# Patient Record
Sex: Male | Born: 1955 | Race: Black or African American | Hispanic: No | Marital: Single | Smoking: Former smoker
Health system: Southern US, Community
[De-identification: ages and names within clinical notes are randomized; demographics above are authoritative.]

## PROBLEM LIST (undated history)

## (undated) DIAGNOSIS — M1990 Unspecified osteoarthritis, unspecified site: Secondary | ICD-10-CM

## (undated) DIAGNOSIS — N400 Enlarged prostate without lower urinary tract symptoms: Secondary | ICD-10-CM

## (undated) DIAGNOSIS — R52 Pain, unspecified: Secondary | ICD-10-CM

## (undated) DIAGNOSIS — Z86718 Personal history of other venous thrombosis and embolism: Secondary | ICD-10-CM

## (undated) DIAGNOSIS — K219 Gastro-esophageal reflux disease without esophagitis: Secondary | ICD-10-CM

## (undated) DIAGNOSIS — N529 Male erectile dysfunction, unspecified: Secondary | ICD-10-CM

## (undated) DIAGNOSIS — I2699 Other pulmonary embolism without acute cor pulmonale: Secondary | ICD-10-CM

## (undated) DIAGNOSIS — I10 Essential (primary) hypertension: Secondary | ICD-10-CM

## (undated) DIAGNOSIS — N401 Enlarged prostate with lower urinary tract symptoms: Secondary | ICD-10-CM

## (undated) HISTORY — PX: JOINT REPLACEMENT: SHX530

## (undated) HISTORY — PX: OTHER SURGICAL HISTORY: SHX169

---

## 1998-06-14 ENCOUNTER — Emergency Department (HOSPITAL_COMMUNITY): Admission: EM | Admit: 1998-06-14 | Discharge: 1998-06-14 | Payer: Self-pay | Admitting: Emergency Medicine

## 1998-06-14 ENCOUNTER — Encounter: Payer: Self-pay | Admitting: Emergency Medicine

## 1999-05-03 ENCOUNTER — Emergency Department (HOSPITAL_COMMUNITY): Admission: EM | Admit: 1999-05-03 | Discharge: 1999-05-03 | Payer: Self-pay | Admitting: Emergency Medicine

## 1999-05-04 ENCOUNTER — Encounter: Payer: Self-pay | Admitting: Emergency Medicine

## 2006-04-14 DIAGNOSIS — I2699 Other pulmonary embolism without acute cor pulmonale: Secondary | ICD-10-CM

## 2006-04-14 HISTORY — PX: HIP SURGERY: SHX245

## 2006-04-14 HISTORY — DX: Other pulmonary embolism without acute cor pulmonale: I26.99

## 2006-10-27 ENCOUNTER — Inpatient Hospital Stay (HOSPITAL_COMMUNITY): Admission: AD | Admit: 2006-10-27 | Discharge: 2006-10-30 | Payer: Self-pay | Admitting: Orthopedic Surgery

## 2006-10-27 HISTORY — PX: TOTAL HIP ARTHROPLASTY: SHX124

## 2007-01-28 ENCOUNTER — Inpatient Hospital Stay (HOSPITAL_COMMUNITY): Admission: EM | Admit: 2007-01-28 | Discharge: 2007-01-29 | Payer: Self-pay | Admitting: Emergency Medicine

## 2007-01-28 DIAGNOSIS — Z86711 Personal history of pulmonary embolism: Secondary | ICD-10-CM

## 2007-01-28 HISTORY — DX: Personal history of pulmonary embolism: Z86.711

## 2007-08-16 ENCOUNTER — Encounter: Admission: RE | Admit: 2007-08-16 | Discharge: 2007-08-16 | Payer: Self-pay | Admitting: Family Medicine

## 2007-12-13 ENCOUNTER — Emergency Department (HOSPITAL_COMMUNITY): Admission: EM | Admit: 2007-12-13 | Discharge: 2007-12-13 | Payer: Self-pay | Admitting: Emergency Medicine

## 2007-12-21 ENCOUNTER — Emergency Department (HOSPITAL_COMMUNITY): Admission: EM | Admit: 2007-12-21 | Discharge: 2007-12-21 | Payer: Self-pay | Admitting: Emergency Medicine

## 2008-08-18 IMAGING — CR DG PORTABLE PELVIS
1 series · 1 of 1 positions shown · non-contrast
Comparison: None available.

CLINICAL DATA: Post left total hip replacement.  
 PORTABLE PELVIS ? 1 VIEW:

[view not recorded]
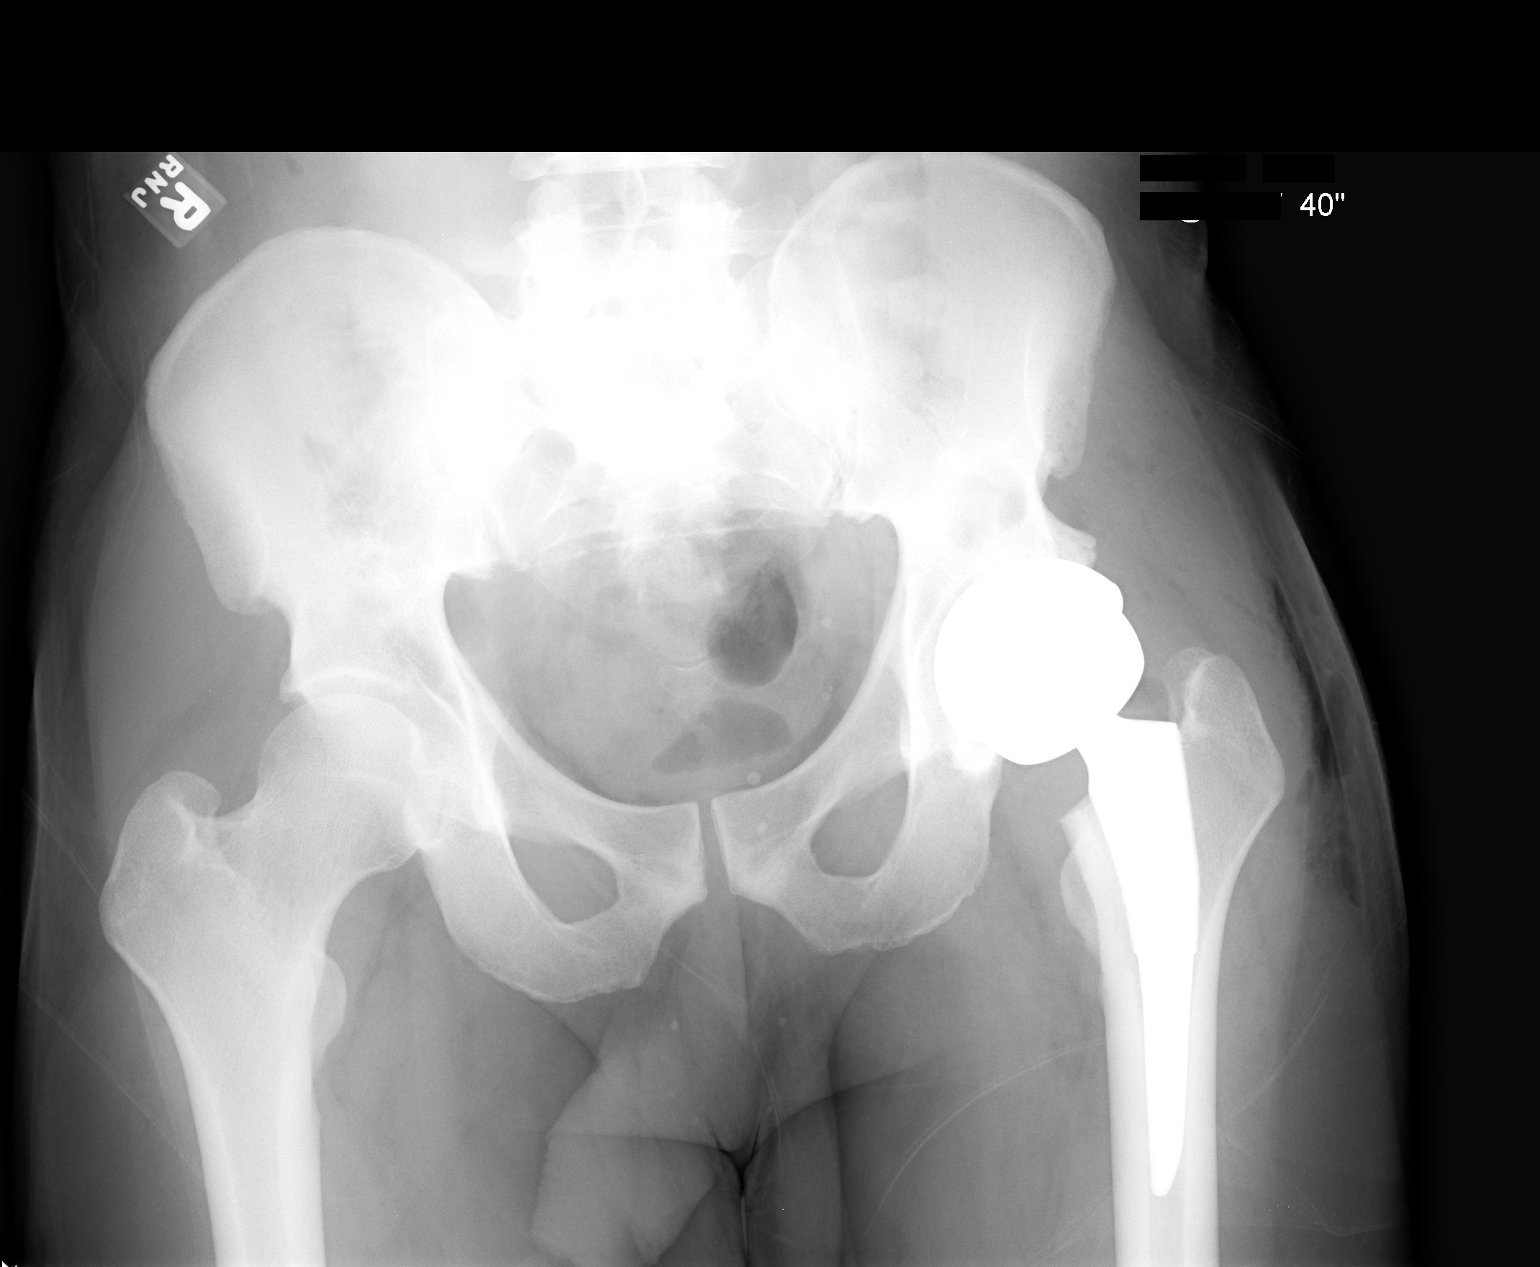

[1 of 1 positions shown; findings below may reference images not displayed]

FINDINGS: Status post left total hip replacement.  The bony elements and prosthesis are well positioned in AP projection.
IMPRESSION: Good position and alignment following left total hip replacement.

## 2009-02-02 ENCOUNTER — Encounter: Admission: RE | Admit: 2009-02-02 | Discharge: 2009-02-02 | Payer: Self-pay | Admitting: Orthopedic Surgery
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
## 2010-08-27 NOTE — Discharge Summary (Signed)
NAMEDIERRE, CREVIER               ACCOUNT NO.:  [id number]   MEDICAL RECORD NO.:  [id number]          PATIENT TYPE:  INP   LOCATION:  4705                         FACILITY:  [facility]   PHYSICIAN:  Michaelyn Barter, M.D. DATE OF BIRTH:  November 16, 1955   DATE OF ADMISSION:  01/28/2007  DATE OF DISCHARGE:  01/29/2007                               DISCHARGE SUMMARY   PRIMARY CARE PHYSICIAN:  Unassigned.   FINAL DIAGNOSIS:  Acute pulmonary emboli.   PROCEDURE:  CT scan of the chest with angiographic contrast completed on  January 28, 2007.   HISTORY OF PRESENT ILLNESS:  Mr. Whitehurst is a 55 year old gentleman with a  past medical history significant only for left total hip replacement  secondary to end stage osteoarthritis on October 27, 2006.  On October 16,  208, he arrived at the hospital with a chief complaint of chest pain.  The chest pain started the morning of his admission.  It was located  bilaterally substernal.  He indicated that the pain radiated to his back  and it was characterized as sharp.  His symptoms were relieved by rest,  and he also complained of some shortness of breath.   PAST MEDICAL HISTORY:  Please see that dictated by Dr. Hannah Beat.   HOSPITAL COURSE:  Acute pulmonary emboli.  The patient had a CT scan of  his chest completed which revealed a pulmonary emboli in the right lower  lobe segmental branch.  Bibasilar pulmonary atelectasis was also noted.  The patient was subsequently started on IV heparin as well as p.r.n.  pain medications.  His pain quickly resolved.  He never complained of  shortness of breath after being admitted into the hospital.  The patient  has indicated multiple times  that he was ready to be discharged home.  I spent quite a bit of time trying to convince the patient to stay in  the hospital secondary to his INR not being therapeutic, however, the  patient still insists that he wants to be discharged home.  Therefore,  the decision has been  made to discharge the patient home as has been  requested multiple times by the patient.  The IV heparin was been  discontinued and the patient has been switched to Lovenox  subcutaneously.   At the time of discharge the patient's vitals were temperature 97.8,  heart rate 70, respirations 20, blood pressure 115/78.  O2 sat 98% on  room air.  The patient appears to be comfortable.  He shows no obvious  distress.   The decision has been made to discharge the patient home as he has  requested.  The patient's PT at the time of discharge is 13.2.  His INR  is 1.   The patient will be discharged home on:  1. Lovenox 120 mg subcutaneous daily.  He will be given a prescription      for a 7-day supply of Lovenox.  2. Warfarin 10 mg, one tablet p.o. daily.   I have called Dr. Earvin Hansen Hill's office to schedule a followup  appointment for Tuesday, October 21st at 5 p.m.  During that time frame,  the patient will have his PT INR checked and his medications can be  adjusted accordingly.      Michaelyn Barter, M.D.  Electronically Signed     OR/MEDQ  D:  01/29/2007  T:  01/29/2007  Job:  [id number]   cc:   Annia Friendly. Loleta Chance, MD  Renaye Rakers, M.D.

## 2010-08-27 NOTE — Op Note (Signed)
Isaac [name], Isaac [name]               ACCOUNT NO.:  [id number]   MEDICAL RECORD NO.:  [id number]          PATIENT TYPE:  INP   LOCATION:  0001                         FACILITY:  [facility]   PHYSICIAN:  Madlyn Frankel. Charlann Boxer, M.D.  DATE OF BIRTH:  [birth year]   DATE OF PROCEDURE:  10/27/2006  DATE OF DISCHARGE:                               OPERATIVE REPORT   PREOPERATIVE DIAGNOSIS:  Left hip end-stage osteoarthritis.   POSTOPERATIVE DIAGNOSIS:  Left hip end-stage osteoarthritis.   PROCEDURE:  Left total hip replacement utilizing DePuy hip system size  54 ASR cup, size 2 standard trilock stem with Gription 47+ 5 ASR ball  and adapter, with autologous acetabular bone grafting.   SURGEON:  Madlyn Frankel. Charlann Boxer, M.D.   ASSISTANT:  Dwyane Luo, PA   ANESTHESIA:  General.   ESTIMATED BLOOD LOSS:  400 mL.   COMPLICATION:  None.   DRAINS:  None.   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   INDICATIONS FOR PROCEDURE:  Isaac [name] is a 55 year old gentleman who  presented to office with left hip pain.  Radiographs revealed the end  stage bone-on-bone arthritis.  Attempts at conservative measures, at  least discussion thereof, failed to provide any significant relief.  He  does not think that the cortisone injection provided any significant  relief.  Given his decreased quality of life and activity level and  requirements from the job standpoint, he wished to proceed with surgical  intervention.  I reviewed with him the risks and benefits of this  procedure long-term sequelae.  I also discussed bearing surface in a  young active male, felt that the best opportunity for long-term  survivability based on the information we have available was a metal-on-  metal hip.  Risks, benefits discussed and his consent obtained.   PROCEDURE IN DETAIL:  The patient was brought to the  operative theater.  Once adequate anesthesia and preoperative antibiotics administered, 2  grams Ancef, the patient was positioned in the right lateral decubitus  position with left side up, left lower extremity was then pre scrubbed  and prepped and draped in sterile fashion.   The lateral based incision was made for posterior approach to the hip.  This iliotibial band and gluteus fascia incised in line for posterior  approach.  The short external rotators were identified and taken down  separate from posterior capsule.  Now capsulotomy was made preserving  the posterior leaflet for later anatomic repair as well as protection  against the sciatic nerve from retractors.   At this point the neck osteotomy was made based off anatomic landmarks  preoperative template.  Attention was first directed to femur. Femoral  preparation was carried out per protocol with a starting reamer followed  by canal finder.  The patient was noted to have a type A femur with a  very tight meta-diaphyseal junction.  I began broaching with a 0 broach  and was unable to get up to a  size 2 broach which matched the neck cut.  Following this I packed femoral canal and attended to the acetabulum.  The acetabulum was noted to have a significant osteophytes and an  ossified labrum and transverse ligament.  Following exposure I then used  an osteotome debrided from posterior inferior osteophyte.  I then began  reaming with a 43 reamer and then went to the even reamers and went up  to size 50 reamer.  At this point there was excellent bony bed  preparation.  I then reamed up to 51 and 53 reamer to solidify my  preparation.  The cyst in the superior acetabulum identified and using a  small curette, I opened up this area and debrided it out.  The lining  was removed.  I then packed it with approximately 15 mL of autologous  bone graft from femoral head and neck.  Following this debridement and  packing, a size 54 ASR cup  was impacted with 35 degrees of abduction, 15  to 20 degrees of forward flexion.  It was anatomically beneath the  anterior Cloe and lined up with the ischium posteriorly.  At this point  attention was redirected back to the femur.  Trial reduction was carried  out with size 2 standard neck and a 47 ball with +2 adapter.  The hip  stability was excellent; however, felt that the patient was just a  little bit short.  I went ahead and used a +5 adapter.  All trial  components removed and the femoral 2 standard stem was impacted to the  level where the broach was.  The final 47+5 ASR with adapter was  impacted onto a clean dry trunnion.  The hip reduced.  The hip was  irrigated throughout the case again at this point.  Following hemostasis  in the posterior capsule I did inject 5 mL of Floseal in this tissue as  well.  The posterior capsule was reapproximated using #1 Ethibond, the  iliotibial band with #1 Ethibond and gluteus fascia with #1 Vicryl.  The  remainder of wound was closed with 2-0 Vicryl and 4-0 running Monocryl.  Hip was cleaned, dried, dressed sterilely with Steri-Strips and Mepilex  dressing.  The patient was brought to recovery in stable condition.      Madlyn Frankel Charlann Boxer, M.D.  Electronically Signed     MDO/MEDQ  D:  10/27/2006  T:  10/27/2006  Job:  [id number]

## 2010-08-27 NOTE — H&P (Signed)
NAMEPROMISE, BUSHONG               ACCOUNT NO.:  [id number]   MEDICAL RECORD NO.:  [id number]          PATIENT TYPE:  INP   LOCATION:  4705                         FACILITY:  [facility]   PHYSICIAN:  Hettie Holstein, D.O.    DATE OF BIRTH:  [birth year]   DATE OF ADMISSION:  01/28/2007  DATE OF DISCHARGE:  01/29/2007                              HISTORY & PHYSICAL   PRIMARY CARE PHYSICIAN:  Unassigned.   CHIEF COMPLAINT:  Chest pain.   HISTORY OF PRESENT ILLNESS:  Mr. Abdallah is a very pleasant 55 year old  male with history of osteoarthritis status post left hip replacement in  July 2008 who had been doing fairly well, completed his postoperative  course of Lovenox and returned to work.  At work today around 9:15 a.m.  he had some right chest pain and back pain.  He continued to work.  Around noon, however, he developed some sudden onset of shortness of  breath and he presented to the medical office at his place of employment  who directed him to the urgent care center where he underwent  evaluation, and ultimately was directed to the emergency department  where he underwent CT scan of his chest that revealed a pulmonary  embolus.  His EKG was unremarkable.   PAST MEDICAL HISTORY:  Significant for:  1. Esophageal reflux.  2. Osteoarthritis as described above, status post left total hip      replacement in July 2008.   MEDICATIONS AT HOME:  None.   ALLERGIES:  No known drug allergies.   SOCIAL HISTORY:  He works as a Location manager at [employer].  He smokes  only occasional tobacco, occasional alcohol.  He is not married, has no  kids.   FAMILY HISTORY:  His mom is age 102 with diabetes.  His biological  father's health history is unknown.   REVIEW OF SYSTEMS:  He has been in his usual state of health.  No  swelling or pain in his lower extremities or calf.  He feels that he has  recovered quite well from his surgery back in July, had no  complications.  In any event, further review  of systems is unremarkable.   PHYSICAL EXAMINATION:  1. Acute pulmonary embolus.  2. Recent hip replacement in July 2008.  3. Tobacco use.   PLAN AT THIS TIME:  We will admit Mr. Toral for IV heparin and  coumadinization and employee the assistance of pharmacy to follow and  achieve therapeutic levels prior to discharge.      Hettie Holstein, D.O.  Electronically Signed     ESS/MEDQ  D:  01/28/2007  T:  01/30/2007  Job:  [id number]

## 2010-08-30 NOTE — Discharge Summary (Signed)
NAMEHAWKIN, CHARO               ACCOUNT NO.:  [id number]   MEDICAL RECORD NO.:  [id number]          PATIENT TYPE:  INP   LOCATION:  1605                         FACILITY:  [facility]   PHYSICIAN:  Madlyn Frankel. Charlann Boxer, M.D.  DATE OF BIRTH:  1955/08/18   DATE OF ADMISSION:  10/27/2006  DATE OF DISCHARGE:  10/30/2006                               DISCHARGE SUMMARY   ADMITTING DIAGNOSES:  1. Osteoarthritis.  2. Esophageal reflux disease.   DISCHARGE DIAGNOSES:  1. Osteoarthritis.  2. Esophageal reflux disease.   HISTORY OF PRESENT ILLNESS:  A 55 year old male with history of  persistent progressive left hip and groin pain secondary to bone on bone  degenerative joint disease.  Refractory to all conservative treatments.   CONSULTS:  None.   PROCEDURE:  Left total hip replacement by surgeon Dr. Durene Romans.  Assistant Dwyane Luo.  Components metal-on-metal.   LABORATORY DATA:  CBC on admission, hematocrit 43.1.  Postoperative day  #1, 38.9.  Postoperative day #2, 33.3  and stable.  Coagulation within  normal limits.  Chemistry:  Glucose at 113, all others normal.  Postoperative day #2, glucose 116, BUN 5.  Postoperative day #2, glucose  122, BUN 4.  Kidney function normal.  GI workup normal.  UA all  negative.   Cardiology:  EKG normal sinus rhythm.   Radiology:  1. Portable pelvis postoperatively good position and alignment      following a left total replacement.  2. Chest, two-view, no active cardiopulmonary disease.   HOSPITAL COURSE:  The patient admitted to the hospital for left total  hip replacement and underwent the procedure, tolerated the procedure  well and was admitted to the orthopedic floor.  Did fine throughout his  course of stay.  He remained afebrile.  His pain was well-controlled.  He was afebrile.  After postoperative day #1, dressing was changed on  daily basis.  Wound had no significant drainage.  He had progressed  nicely with his physical therapy.  By day 2,  he was walking at least 100  feet.  He was weightbearing as tolerated with the use a rolling walker  throughout.  By day 3,  he again had progressed.  He was improved.  Dressing changed.  No active drainage.  Neurovascularly intact.  He was  ready for home health care PT.   DISCHARGE DISPOSITION:  Discharged with home health care PT, stable and  improved condition.   DISCHARGE PHYSICAL THERAPY:  He is weightbearing as tolerated with the  use of a rolling walker for 2 weeks, then transition to single-point  cane.  Goals of physical therapy would be to minimize pain, maximize  strength, increase range of motion and encourage independence of  activities of daily living as well as work on gait training and  proprioception.   DISCHARGE DIET:  Regular as tolerated by the patient.   DISCHARGE WOUND CARE:  Keep dry.  Change dressing on a daily basis.   DISCHARGE FOLLOWUP:  1. Follow-up with Dr. Nilsa Nutting office, [phone], in 2 weeks.  2. If the patient develops acute shortness of breath, severe  calf      pain, call emergency services immediately.   DISCHARGE MEDICATIONS:  1. Lovenox 40 mg subcutaneous q.24h. for 11 days.  2. Robaxin 500 mg p.o. q.6h.  3. Colace 100 mg p.o. b.i.d.  4. MiraLax 17 grams p.o. daily.  5. Enteric-coated aspirin 325 mg p.o. daily for 4 weeks after Lovenox      completed.  6. Iron 325 mg p.o. t.i.d. for 3 weeks.  7. Vicodin 5/325 one to two p.o. q.4-6h.     ______________________________  Yetta Glassman. [address]oreta [address], [place]      Madlyn Frankel. Charlann Boxer, M.D.  Electronically Signed    BLM/MEDQ  D:  11/10/2006  T:  11/11/2006  Job:  [id number]

## 2010-08-30 NOTE — H&P (Signed)
Isaac, [name]               ACCOUNT NO.:  [id number]   MEDICAL RECORD NO.:  [id number]        PATIENT TYPE:  LINP   LOCATION:                               FACILITY:  [facility]   PHYSICIAN:  Isaac [name], M.D.  DATE OF BIRTH:  10-Feb-1956   DATE OF ADMISSION:  10/27/2006  DATE OF DISCHARGE:                              HISTORY & PHYSICAL   PROCEDURE:  Left total hip arthroplasty.   CHIEF COMPLAINT:  Left hip groin pain.   HISTORY OF PRESENT ILLNESS:  This is a 55 year old male with a history  of persistent progressive left hip groin pain secondary to bone on bone  degenerative joint disease.  He has been refractory to all conservative  treatments.  He is scheduled for left total hip replacement.   PAST MEDICAL HISTORY:  Healthy, none.   FAMILY MEDICAL HISTORY:  Diabetes.   SOCIAL HISTORY:  He is a social smoker and occasional alcohol.  Primary  caregiver will be Isaac [name] after surgery.   ALLERGIES:  NO KNOWN DRUG ALLERGIES.   MEDICATIONS:  None.   REVIEW OF SYSTEMS:  PULMONARY:  Cough.  Otherwise, see HPI.   PHYSICAL EXAMINATION:  VITAL SIGNS:  Pulse 72, respirations 18, blood  pressure 126/92.  GENERAL:  He is awake, alert and oriented, well-developed, well-  nourished in no acute distress.  NECK:  Supple.  No carotid bruits.  CHEST/LUNGS:  Clear to auscultation bilaterally.  BREASTS:  Deferred.  HEART:  Regular rate and rhythm without gallops, clicks, rubs or  murmurs.  ABDOMEN:  Soft, nontender, nondistended.  Bowel sounds present in all  four quadrants.  GENITOURINARY:  Deferred.  EXTREMITIES:  Left hip has increased pain with range of motion.  SKIN:  Intact.  No signs of cellulitis.  NEUROLOGIC:  Intact distal sensibilities.   LABORATORY DATA AND X-RAY FINDINGS:  Labs, EKG, chest x-ray all pending.   IMPRESSION:  Left hip degenerative joint disease .   PLAN:  Plan of action is a left total hip arthroplasty on October 27, 2006,  at [facility] by  surgeon Dr. Durene [name].  Risks and  complications were discussed.  Questions were encouraged, answered and  reviewed.     ______________________________  Isaac [name], [place]      Isaac [name], M.D.  Electronically Signed    BLM/MEDQ  D:  10/12/2006  T:  10/13/2006  Job:  [id number]

## 2011-01-15 LAB — DIFFERENTIAL
Basophils Absolute: 0
Basophils Relative: 1
Eosinophils Absolute: 0.1
Monocytes Absolute: 0.3
Neutro Abs: 2.1

## 2011-01-15 LAB — POCT I-STAT, CHEM 8
Calcium, Ion: 1.24
Chloride: 108
HCT: 46
Hemoglobin: 15.6
TCO2: 26

## 2011-01-15 LAB — CBC
Hemoglobin: 14.7
MCHC: 33.8
MCV: 87.4
RDW: 14.5

## 2011-01-15 LAB — POCT CARDIAC MARKERS: Myoglobin, poc: 64.7

## 2011-01-22 LAB — APTT: aPTT: 29

## 2011-01-22 LAB — CBC
Hemoglobin: 13.9
MCHC: 33.6
RBC: 4.82
RDW: 15.1 — ABNORMAL HIGH

## 2011-01-22 LAB — I-STAT 8, (EC8 V) (CONVERTED LAB)
BUN: 12
Chloride: 104
Glucose, Bld: 91
Hemoglobin: 15.6
Potassium: 3.9
Sodium: 140
pH, Ven: 7.35 — ABNORMAL HIGH

## 2011-01-22 LAB — PROTIME-INR
INR: 1
Prothrombin Time: 12.8
Prothrombin Time: 13.2

## 2011-01-22 LAB — HEPARIN LEVEL (UNFRACTIONATED): Heparin Unfractionated: 0.75 — ABNORMAL HIGH

## 2011-01-22 LAB — POCT CARDIAC MARKERS
Myoglobin, poc: 53.1
Operator id: 272551
Troponin i, poc: <0.05

## 2011-01-27 LAB — CBC
HCT: 33.3 — ABNORMAL LOW
Hemoglobin: 11.4 — ABNORMAL LOW
MCHC: 34.1
MCV: 84.8
MCV: 85.1
Platelets: 194
RBC: 3.91 — ABNORMAL LOW
RBC: 4.58
WBC: 7.1
WBC: 8.4

## 2011-01-27 LAB — BASIC METABOLIC PANEL
BUN: 5 — ABNORMAL LOW
CO2: 28
Chloride: 101
Chloride: 109
Creatinine, Ser: 1.02
GFR calc Af Amer: >60
GFR calc Af Amer: >60
GFR calc non Af Amer: >60
Potassium: 3.5
Potassium: 3.9

## 2011-01-28 LAB — URINALYSIS, ROUTINE W REFLEX MICROSCOPIC
Bilirubin Urine: NEGATIVE
Glucose, UA: NEGATIVE
Ketones, ur: NEGATIVE
Protein, ur: NEGATIVE

## 2011-01-28 LAB — CBC
HCT: 43.1
MCHC: 34.5
MCV: 83.6
Platelets: 220
RDW: 14.9 — ABNORMAL HIGH

## 2011-01-28 LAB — COMPREHENSIVE METABOLIC PANEL
ALT: 32
AST: 22
Calcium: 9.3
GFR calc Af Amer: >60
Sodium: 140
Total Protein: 6.9

## 2011-01-28 LAB — APTT: aPTT: 28

## 2011-01-28 LAB — TYPE AND SCREEN
ABO/RH(D): B POS
Antibody Screen: NEGATIVE

## 2011-01-28 LAB — ABO/RH: ABO/RH(D): B POS

## 2012-03-16 ENCOUNTER — Emergency Department (HOSPITAL_COMMUNITY)
Admission: EM | Admit: 2012-03-16 | Discharge: 2012-03-16 | Disposition: A | Discharge status: Home / Self Care | Payer: 59 | Attending: Emergency Medicine | Admitting: Emergency Medicine

## 2012-03-16 ENCOUNTER — Emergency Department (HOSPITAL_COMMUNITY): Payer: 59

## 2012-03-16 ENCOUNTER — Encounter (HOSPITAL_COMMUNITY): Payer: Self-pay

## 2012-03-16 DIAGNOSIS — R1013 Epigastric pain: Secondary | ICD-10-CM

## 2012-03-16 DIAGNOSIS — F17200 Nicotine dependence, unspecified, uncomplicated: Secondary | ICD-10-CM | POA: Insufficient documentation

## 2012-03-16 DIAGNOSIS — Z862 Personal history of diseases of the blood and blood-forming organs and certain disorders involving the immune mechanism: Secondary | ICD-10-CM | POA: Insufficient documentation

## 2012-03-16 HISTORY — DX: Personal history of other venous thrombosis and embolism: Z86.718

## 2012-03-16 LAB — COMPREHENSIVE METABOLIC PANEL
Albumin: 3.8 g/dL (ref 3.5–5.2)
Alkaline Phosphatase: 50 U/L (ref 39–117)
BUN: 17 mg/dL (ref 6–23)
Calcium: 9.1 mg/dL (ref 8.4–10.5)
Creatinine, Ser: 1.07 mg/dL (ref 0.50–1.35)
GFR calc Af Amer: 89 mL/min — ABNORMAL LOW (ref >90)
Glucose, Bld: 97 mg/dL (ref 70–99)
Potassium: 4 mEq/L (ref 3.5–5.1)
Total Protein: 6.7 g/dL (ref 6.0–8.3)

## 2012-03-16 LAB — POCT I-STAT TROPONIN I: Troponin i, poc: 0 ng/mL (ref 0.00–0.08)

## 2012-03-16 LAB — LIPASE, BLOOD: Lipase: 46 U/L (ref 11–59)

## 2012-03-16 LAB — CBC
HCT: 40.9 % (ref 39.0–52.0)
MCH: 28.3 pg (ref 26.0–34.0)
MCHC: 34 g/dL (ref 30.0–36.0)
RDW: 14.7 % (ref 11.5–15.5)

## 2012-03-16 MED ORDER — ONDANSETRON HCL 4 MG/2ML IJ SOLN
4.0000 mg | Freq: Once | INTRAMUSCULAR | Status: AC
  Administered 2012-03-16: 4 mg via INTRAVENOUS
  Filled 2012-03-16: qty 2

## 2012-03-16 MED ORDER — MORPHINE SULFATE 4 MG/ML IJ SOLN
4.0000 mg | Freq: Once | INTRAMUSCULAR | Status: AC
  Administered 2012-03-16: 4 mg via INTRAVENOUS
  Filled 2012-03-16: qty 1

## 2012-03-16 MED ORDER — RANITIDINE HCL 150 MG PO TABS: 150.0000 mg | ORAL_TABLET | Freq: Two times a day (BID) | ORAL | Status: DC

## 2012-03-16 MED ORDER — SODIUM CHLORIDE 0.9 % IV SOLN
Freq: Once | INTRAVENOUS | Status: AC
  Administered 2012-03-16: 10 mL via INTRAVENOUS

## 2012-03-16 MED ORDER — IOHEXOL 350 MG/ML SOLN
100.0000 mL | Freq: Once | INTRAVENOUS | Status: AC | PRN
  Administered 2012-03-16: 100 mL via INTRAVENOUS

## 2012-03-16 NOTE — ED Notes (Signed)
Pt reports rip pain only with movement.

## 2012-03-16 NOTE — ED Notes (Signed)
Pt.  Was at work 3rd shift and developed bilateral rib pain,  Pain radiated into his back .  Movement increased his pain. Pt. Denies any injury,  Also reports having lower back pain last week.  Pt. Denies any dizziness or nausea, skin is w/d

## 2012-03-16 NOTE — ED Notes (Signed)
Pt returned from CT °

## 2012-03-16 NOTE — ED Notes (Signed)
Pt brought back to room; pt undressed, in gown, on monitor, continuous pulse oximetry and blood pressure cuff; EKG performed; sheet provided

## 2012-03-16 NOTE — ED Provider Notes (Signed)
History     CSN: [id number]  Arrival date & time 03/16/12  7829   First MD Initiated Contact with Patient 03/16/12 [phone]      Chief Complaint  Patient presents with  . Chest Pain    (Consider location/radiation/quality/duration/timing/severity/associated sxs/prior treatment) HPI Pt presenting with c/o bialteral rib pain- pain started this morning- states it is sharp in nature and worse when he takes a deep breath.  No fever/no cough.  Mild epigastric abdominal pain as well.  He states he pain is similar in nature to when he had a PE in the past.  He no longer takes coumadin.  No anterior chest pain, no sob, no nausea/diaphoresis or radiation of the pain.  No vomiting or fevers.  He has had no treatment for this pain prior to arrival.  There are no other associated systemic symptoms, there are no other alleviating or modifying factors.   Past Medical History  Diagnosis Date  . History of blood clots     lt lung    Past Surgical History  Procedure Date  . Hip surgery     Left    History reviewed. No pertinent family history.  History  Substance Use Topics  . Smoking status: Current Some Day Smoker    Types: Cigars  . Smokeless tobacco: Not on file  . Alcohol Use: Yes     Comment: occasional      Review of Systems ROS reviewed and all otherwise negative except for mentioned in HPI  Allergies  Review of patient's allergies indicates no known allergies.  Home Medications   Current Outpatient Rx  Name  Route  Sig  Dispense  Refill  . IBUPROFEN 200 MG PO TABS   Oral   Take 400 mg by mouth every 6 (six) hours as needed. For pain         . OVER THE COUNTER MEDICATION   Oral   Take 1 tablet by mouth as needed. Medication: over the counter herbal "male enhancement" product         . RANITIDINE HCL 150 MG PO TABS   Oral   Take 1 tablet (150 mg total) by mouth 2 (two) times daily.   60 tablet   0     BP 126/87  Pulse 75  Temp 97.5 F (36.4 C) (Oral)   Resp 12  Ht 5' 7.5" (1.715 m)  Wt 180 lb (81.647 kg)  BMI 27.78 kg/m2  SpO2 100% Vitals reviewed Physical Exam Physical Examination: General appearance - alert, well appearing, and in no distress Mental status - alert, oriented to person, place, and time Eyes - no conjunctival injection, no scleral icterus Mouth - mucous membranes moist, pharynx normal without lesions Chest - clear to auscultation, no wheezes, rales or rhonchi, symmetric air entry Heart - normal rate, regular rhythm, normal S1, S2, no murmurs, rubs, clicks or gallops Abdomen - soft, mild epigastric tenderness to palpation, no gaurding or rebound, nondistended, no masses or organomegaly Extremities - peripheral pulses normal, no pedal edema, no clubbing or cyanosis Skin - normal coloration and turgor, no rashes  ED Course  Procedures (including critical care time)   Date: 03/16/2012  Rate: 76  Rhythm: normal sinus rhythm  QRS Axis: normal  Intervals: normal  ST/T Wave abnormalities: normal  Conduction Disutrbances: none  Narrative Interpretation: unremarkable       Labs Reviewed  COMPREHENSIVE METABOLIC PANEL - Abnormal; Notable for the following:    GFR calc non Af Amer 77 (*)  GFR calc Af Amer 89 (*)     All other components within normal limits  CBC  LIPASE, BLOOD  POCT I-STAT TROPONIN I   Ct Angio Chest Pe W/cm &/or Wo Cm  03/16/2012  *RADIOLOGY REPORT*  Clinical Data: Chest pain, history of pulmonary embolus, bilateral ribs pain  CT ANGIOGRAPHY CHEST  Technique:  Multidetector CT imaging of the chest using the standard protocol during bolus administration of intravenous contrast. Multiplanar reconstructed images including MIPs were obtained and reviewed to evaluate the vascular anatomy.  Contrast: OMNIPAQUE IOHEXOL 350 MG/ML SOLN  Comparison: 12/21/2007  Findings: Images of the thoracic inlet are unremarkable.  Central airways are patent.  Sagittal images of the spine are unremarkable.  There  is no mediastinal hematoma or adenopathy.  Heart size is stable.  No pericardial effusion.  Images of the lung parenchyma shows no acute infiltrate or pulmonary edema.  Mild dependent atelectasis noted lung bases posteriorly.  There is no CT evidence of pulmonary embolus.  The visualized upper abdomen is unremarkable.  No adrenal gland mass is noted.  No pulmonary nodules are identified.  Small hiatal hernia.  No aortic aneurysm.    No hilar adenopathy.  IMPRESSION:  1.  No pulmonary embolus. 2.  No acute infiltrate or pulmonary edema. 3.  Mild dependent atelectasis lung bases posteriorly.   Original Report Authenticated By: Natasha Mead, M.D.      1. Epigastric pain       MDM  Pt presents with c/o bilateral lower rib pain and mild epigastric pain.  Pt states pain feels similar to his prior PE.  Workup in the ED is reassuring including CT angio of chest which does not show PE.  Discharged with strict return precautions.  Pt agreeable with plan.        Ethelda Chick, MD 03/16/12 1331

## 2012-12-15 ENCOUNTER — Encounter (HOSPITAL_COMMUNITY): Payer: Self-pay | Admitting: Pharmacy Technician

## 2012-12-22 ENCOUNTER — Encounter (HOSPITAL_COMMUNITY): Payer: Self-pay

## 2012-12-22 ENCOUNTER — Encounter (HOSPITAL_COMMUNITY)
Admission: RE | Admit: 2012-12-22 | Discharge: 2012-12-22 | Disposition: A | Discharge status: Home / Self Care | Payer: 59 | Source: Ambulatory Visit | Attending: Orthopedic Surgery | Admitting: Orthopedic Surgery

## 2012-12-22 DIAGNOSIS — Y831 Surgical operation with implant of artificial internal device as the cause of abnormal reaction of the patient, or of later complication, without mention of misadventure at the time of the procedure: Secondary | ICD-10-CM | POA: Insufficient documentation

## 2012-12-22 DIAGNOSIS — Z01812 Encounter for preprocedural laboratory examination: Secondary | ICD-10-CM | POA: Insufficient documentation

## 2012-12-22 DIAGNOSIS — Z96649 Presence of unspecified artificial hip joint: Secondary | ICD-10-CM | POA: Insufficient documentation

## 2012-12-22 DIAGNOSIS — T84099A Other mechanical complication of unspecified internal joint prosthesis, initial encounter: Secondary | ICD-10-CM | POA: Insufficient documentation

## 2012-12-22 HISTORY — DX: Pain, unspecified: R52

## 2012-12-22 HISTORY — DX: Gastro-esophageal reflux disease without esophagitis: K21.9

## 2012-12-22 HISTORY — DX: Other pulmonary embolism without acute cor pulmonale: I26.99

## 2012-12-22 HISTORY — DX: Unspecified osteoarthritis, unspecified site: M19.90

## 2012-12-22 LAB — URINALYSIS, ROUTINE W REFLEX MICROSCOPIC
Bilirubin Urine: NEGATIVE
Leukocytes, UA: NEGATIVE
Nitrite: NEGATIVE
Specific Gravity, Urine: 1.021 (ref 1.005–1.030)
pH: 7 (ref 5.0–8.0)

## 2012-12-22 LAB — BASIC METABOLIC PANEL
BUN: 14 mg/dL (ref 6–23)
CO2: 30 mEq/L (ref 19–32)
Chloride: 102 mEq/L (ref 96–112)
GFR calc Af Amer: >90 mL/min (ref >90)
Potassium: 4.5 mEq/L (ref 3.5–5.1)

## 2012-12-22 LAB — SURGICAL PCR SCREEN
MRSA, PCR: NEGATIVE
Staphylococcus aureus: POSITIVE — AB

## 2012-12-22 LAB — CBC
HCT: 45.1 % (ref 39.0–52.0)
Hemoglobin: 15.5 g/dL (ref 13.0–17.0)
MCH: 28.7 pg (ref 26.0–34.0)
MCHC: 34.4 g/dL (ref 30.0–36.0)
MCV: 83.5 fL (ref 78.0–100.0)
Platelets: 220 10*3/uL (ref 150–400)
RBC: 5.4 MIL/uL (ref 4.22–5.81)
RDW: 14.2 % (ref 11.5–15.5)
WBC: 3.7 10*3/uL — ABNORMAL LOW (ref 4.0–10.5)

## 2012-12-22 LAB — PROTIME-INR
INR: 0.97 (ref 0.00–1.49)
Prothrombin Time: 12.7 seconds (ref 11.6–15.2)

## 2012-12-22 LAB — APTT: aPTT: 31 seconds (ref 24–37)

## 2012-12-22 NOTE — Pre-Procedure Instructions (Signed)
CT ANGIO OF CHEST REPORT AND EKG REPORT ARE IN EPIC FROM 03/16/12.

## 2012-12-22 NOTE — Patient Instructions (Signed)
YOUR SURGERY IS SCHEDULED AT [facility]  ON:  Monday  September 15 th  REPORT TO [facility] AT:  5:30 AM      PHONE # FOR SHORT STAY IS [phone]  DO NOT EAT OR DRINK ANYTHING AFTER MIDNIGHT THE NIGHT BEFORE YOUR SURGERY.  YOU MAY BRUSH YOUR TEETH, RINSE OUT YOUR MOUTH--BUT NO WATER, NO FOOD, NO CHEWING GUM, NO MINTS, NO CANDIES, NO CHEWING TOBACCO.  PLEASE TAKE THE FOLLOWING MEDICATIONS THE AM OF YOUR SURGERY WITH A FEW SIPS OF WATER:  NO MEDS TO TAKE   DO NOT BRING VALUABLES, MONEY, CREDIT CARDS.  DO NOT WEAR JEWELRY, MAKE-UP, NAIL POLISH AND NO METAL PINS OR CLIPS IN YOUR HAIR. CONTACT LENS, DENTURES / PARTIALS, GLASSES SHOULD NOT BE WORN TO SURGERY AND IN MOST CASES-HEARING AIDS WILL NEED TO BE REMOVED.  BRING YOUR GLASSES CASE, ANY EQUIPMENT NEEDED FOR YOUR CONTACT LENS. FOR PATIENTS ADMITTED TO THE HOSPITAL--CHECK OUT TIME THE DAY OF DISCHARGE IS 11:00 AM.  ALL INPATIENT ROOMS ARE PRIVATE - WITH BATHROOM, TELEPHONE, TELEVISION AND WIFI INTERNET.                                PLEASE READ OVER ANY  FACT SHEETS THAT YOU WERE GIVEN: MRSA INFORMATION, BLOOD TRANSFUSION INFORMATION, INCENTIVE SPIROMETER INFORMATION. FAILURE TO FOLLOW THESE INSTRUCTIONS MAY RESULT IN THE CANCELLATION OF YOUR SURGERY.   PATIENT SIGNATURE_________________________________

## 2012-12-23 NOTE — H&P (Signed)
TOTAL HIP REVISION ADMISSION H&P  Patient is admitted for left revision total hip arthroplasty.  Subjective:  Chief Complaint: Left hip pain S/P metal-on-metal hp replacement  HPI: Isaac [name], 57 y.o. male, has a history of pain and functional disability in the left hip due to previous metal-on-metal total hip arthroplasty. The indications for the revision total hip arthroplasty are pain and increased metal ion levels in the blood.  Onset of symptoms was gradual starting 4 years ago with rapidlly worsening course since that time.  Prior procedures on the left hip include arthroplasty.  Patient currently rates pain in the left hip at 10 out of 10 with activity.  There is night pain, worsening of pain with activity and weight bearing, trendelenberg gait, pain that interfers with activities of daily living and pain with passive range of motion. Patient has evidence of previous metal-on-metal total hip arthroplasty by imaging studies.  This condition presents safety issues increasing the risk of falls.  There is no current active signs of infection.  Risks, benefits and expectations were discussed with the patient. Patient understand the risks, benefits and expectations and wishes to proceed with surgery.   D/C Plans:   Home with HHPT  Post-op Meds:    Rx given for Xarelto, ASA, Robaxin, Iron, Colace and MiraLax  Tranexamic Acid:   Not to be given - previous PE  Decadron:    To be given  FYI:    Xarelto and then ASA post-op    Past Medical History  Diagnosis Date  . History of blood clots     lt lung  . PE (pulmonary embolism) 2008    PE SEVERAL MONTHS AFTER HIP REPLACEMENT SURGERY  . GERD (gastroesophageal reflux disease)     ONE SEVERE EPISODE - WENT TO [place] 03/16/12.   NOT ON ANY MEDS AT PRESENT  . Arthritis     HX OA LEFT HIP  . 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 Hip surgery  2008    Left TOTAL HIP ARTHROPLASTY  . Joint replacement      LEFT TOTAL HIP ARTHROPLASTY  . Tooth pulled under anesthesia      No prescriptions prior to admission   Allergies  Allergen Reactions  . Other     PT STATES SOME EYEDROP CAUSED EYE REDNESS - PT DOES NOT REMEMBER NAME OF MEDICINE-THIS WAS YEARS AGO    History  Substance Use Topics  . Smoking status: Current Some Day Smoker    Types: Cigars  . Smokeless tobacco: Never Used  . Alcohol Use: Yes     Comment: occasional  WINE OR BEER ; OCCAS CIGAR      Review of Systems  Constitutional: Negative.   HENT: Negative.   Eyes: Negative.   Respiratory: Negative.   Cardiovascular: Negative.   Gastrointestinal: Positive for heartburn.  Genitourinary: Negative.   Musculoskeletal: Positive for joint pain.  Skin: Negative.   Neurological: Negative.   Endo/Heme/Allergies: Negative.   Psychiatric/Behavioral: Negative.     Objective:  Physical Exam  Constitutional: He is oriented to person, place, and time. He appears well-developed and well-nourished.  HENT:  Head: Normocephalic and atraumatic.  Mouth/Throat: Oropharynx is clear and moist.  Eyes: Pupils are equal, round, and reactive to light.  Neck: Neck supple. No JVD present. No tracheal deviation present. No thyromegaly present.  Cardiovascular: Normal rate, regular rhythm, normal heart sounds and intact distal pulses.   Respiratory: Effort normal and breath sounds normal. No stridor. No respiratory distress. He has no wheezes.  GI: Soft. There is no tenderness. There is no guarding.  Musculoskeletal:       Left hip: He exhibits decreased range of motion, decreased strength, tenderness, bony tenderness, crepitus and laceration (healed from previous surgery). He exhibits no deformity.  Lymphadenopathy:    He has no cervical adenopathy.  Neurological: He is alert and oriented to person, place, and time.  Skin: Skin is warm and dry.  Psychiatric:  He has a normal mood and affect.     Labs:  Estimated body mass index is 27.76 kg/(m^2) as calculated from the following:   Height as of 03/16/12: 5' 7.5" (1.715 m).   Weight as of 03/16/12: 81.647 kg (180 lb).  Imaging Review:  Plain radiographs demonstrate previous metal-on-metal total hip arthroplasty of the left hip(s). The bone quality appears to be good for age and reported activity level.   Assessment/Plan:  Left hip, failed previous arthroplasty.  The patient history, physical examination, clinical judgement of the provider and imaging studies are consistent with end stage degenerative joint disease of the left hip(s), previous total hip arthroplasty. Revision total hip arthroplasty is deemed medically necessary. The treatment options including medical management, injection therapy, arthroscopy and arthroplasty were discussed at length. The risks and benefits of total hip arthroplasty were presented and reviewed. The risks due to aseptic loosening, infection, stiffness, dislocation/subluxation,  thromboembolic complications and other imponderables were discussed.  The patient acknowledged the explanation, agreed to proceed with the plan and consent was signed. Patient is being admitted for inpatient treatment for surgery, pain control, PT, OT, prophylactic antibiotics, VTE prophylaxis, progressive ambulation and ADL's and discharge planning. The patient is planning to be discharged home with home health services.    Anastasio Auerbach Bryden Darden   PAC  12/23/2012, 3:36 PM

## 2012-12-27 ENCOUNTER — Ambulatory Visit (HOSPITAL_COMMUNITY): Payer: 59 | Admitting: Certified Registered Nurse Anesthetist

## 2012-12-27 ENCOUNTER — Inpatient Hospital Stay (HOSPITAL_COMMUNITY)
Admission: RE | Admit: 2012-12-27 | Discharge: 2012-12-28 | DRG: 468 | Disposition: A | Discharge status: Home Health | Payer: 59 | Source: Ambulatory Visit | Attending: Orthopedic Surgery | Admitting: Orthopedic Surgery

## 2012-12-27 ENCOUNTER — Encounter (HOSPITAL_COMMUNITY): Payer: Self-pay | Admitting: *Deleted

## 2012-12-27 ENCOUNTER — Ambulatory Visit (HOSPITAL_COMMUNITY): Payer: 59

## 2012-12-27 ENCOUNTER — Encounter (HOSPITAL_COMMUNITY)
Admission: RE | Disposition: A | Discharge status: Home Health | Payer: Self-pay | Source: Ambulatory Visit | Attending: Orthopedic Surgery

## 2012-12-27 ENCOUNTER — Encounter (HOSPITAL_COMMUNITY): Payer: Self-pay | Admitting: Certified Registered Nurse Anesthetist

## 2012-12-27 DIAGNOSIS — M169 Osteoarthritis of hip, unspecified: Secondary | ICD-10-CM | POA: Diagnosis present

## 2012-12-27 DIAGNOSIS — T84099A Other mechanical complication of unspecified internal joint prosthesis, initial encounter: Principal | ICD-10-CM | POA: Diagnosis present

## 2012-12-27 DIAGNOSIS — Z96649 Presence of unspecified artificial hip joint: Secondary | ICD-10-CM

## 2012-12-27 DIAGNOSIS — R7989 Other specified abnormal findings of blood chemistry: Secondary | ICD-10-CM | POA: Diagnosis present

## 2012-12-27 DIAGNOSIS — Y831 Surgical operation with implant of artificial internal device as the cause of abnormal reaction of the patient, or of later complication, without mention of misadventure at the time of the procedure: Secondary | ICD-10-CM | POA: Diagnosis present

## 2012-12-27 DIAGNOSIS — M1610 Unilateral primary osteoarthritis, unspecified hip: Secondary | ICD-10-CM | POA: Diagnosis present

## 2012-12-27 DIAGNOSIS — Z86711 Personal history of pulmonary embolism: Secondary | ICD-10-CM

## 2012-12-27 DIAGNOSIS — Z01812 Encounter for preprocedural laboratory examination: Secondary | ICD-10-CM

## 2012-12-27 DIAGNOSIS — F17200 Nicotine dependence, unspecified, uncomplicated: Secondary | ICD-10-CM | POA: Diagnosis present

## 2012-12-27 HISTORY — PX: TOTAL HIP REVISION: SHX763

## 2012-12-27 LAB — TYPE AND SCREEN: Antibody Screen: NEGATIVE

## 2012-12-27 SURGERY — TOTAL HIP REVISION
Anesthesia: Spinal | Site: Hip | Laterality: Left | Wound class: Clean

## 2012-12-27 MED ORDER — METHOCARBAMOL 500 MG PO TABS
500.0000 mg | ORAL_TABLET | Freq: Four times a day (QID) | ORAL | Status: DC | PRN
  Administered 2012-12-27 – 2012-12-28 (×2): 500 mg via ORAL
  Filled 2012-12-27 (×2): qty 1

## 2012-12-27 MED ORDER — DEXAMETHASONE SODIUM PHOSPHATE 10 MG/ML IJ SOLN: 10.0000 mg | Freq: Once | INTRAMUSCULAR | Status: DC

## 2012-12-27 MED ORDER — DIPHENHYDRAMINE HCL 25 MG PO CAPS: 25.0000 mg | ORAL_CAPSULE | Freq: Four times a day (QID) | ORAL | Status: DC | PRN

## 2012-12-27 MED ORDER — PROMETHAZINE HCL 25 MG/ML IJ SOLN: 6.2500 mg | INTRAMUSCULAR | Status: DC | PRN

## 2012-12-27 MED ORDER — METOCLOPRAMIDE HCL 5 MG/ML IJ SOLN: 5.0000 mg | Freq: Three times a day (TID) | INTRAMUSCULAR | Status: DC | PRN

## 2012-12-27 MED ORDER — DOCUSATE SODIUM 100 MG PO CAPS
100.0000 mg | ORAL_CAPSULE | Freq: Two times a day (BID) | ORAL | Status: DC
  Administered 2012-12-27 – 2012-12-28 (×2): 100 mg via ORAL

## 2012-12-27 MED ORDER — SODIUM CHLORIDE 0.9 % IV SOLN
100.0000 mL/h | INTRAVENOUS | Status: DC
  Administered 2012-12-27: 100 mL/h via INTRAVENOUS
  Administered 2012-12-28: 20 mL/h via INTRAVENOUS
  Filled 2012-12-27 (×4): qty 1000

## 2012-12-27 MED ORDER — EPHEDRINE SULFATE 50 MG/ML IJ SOLN
INTRAMUSCULAR | Status: DC | PRN
  Administered 2012-12-27 (×3): 5 mg via INTRAVENOUS

## 2012-12-27 MED ORDER — MENTHOL 3 MG MT LOZG
1.0000 | LOZENGE | OROMUCOSAL | Status: DC | PRN
  Filled 2012-12-27: qty 9

## 2012-12-27 MED ORDER — ALUM & MAG HYDROXIDE-SIMETH 200-200-20 MG/5ML PO SUSP: 30.0000 mL | ORAL | Status: DC | PRN

## 2012-12-27 MED ORDER — METHOCARBAMOL 100 MG/ML IJ SOLN
500.0000 mg | Freq: Four times a day (QID) | INTRAVENOUS | Status: DC | PRN
  Administered 2012-12-27: 500 mg via INTRAVENOUS
  Filled 2012-12-27 (×2): qty 5

## 2012-12-27 MED ORDER — ONDANSETRON HCL 4 MG/2ML IJ SOLN
INTRAMUSCULAR | Status: DC | PRN
  Administered 2012-12-27: 4 mg via INTRAVENOUS

## 2012-12-27 MED ORDER — CEFAZOLIN SODIUM-DEXTROSE 2-3 GM-% IV SOLR
2.0000 g | INTRAVENOUS | Status: AC
  Administered 2012-12-27: 2 g via INTRAVENOUS

## 2012-12-27 MED ORDER — BUPIVACAINE HCL (PF) 0.5 % IJ SOLN
INTRAMUSCULAR | Status: AC
  Filled 2012-12-27: qty 30

## 2012-12-27 MED ORDER — HYDROMORPHONE HCL PF 1 MG/ML IJ SOLN: 0.2500 mg | INTRAMUSCULAR | Status: DC | PRN

## 2012-12-27 MED ORDER — PROPOFOL INFUSION 10 MG/ML OPTIME
INTRAVENOUS | Status: DC | PRN
  Administered 2012-12-27: 100 ug/kg/min via INTRAVENOUS

## 2012-12-27 MED ORDER — BUPIVACAINE HCL (PF) 0.5 % IJ SOLN
INTRAMUSCULAR | Status: DC | PRN
  Administered 2012-12-27: 3 mL

## 2012-12-27 MED ORDER — CHLORHEXIDINE GLUCONATE 4 % EX LIQD
60.0000 mL | Freq: Once | CUTANEOUS | Status: DC
  Filled 2012-12-27: qty 60

## 2012-12-27 MED ORDER — LACTATED RINGERS IV SOLN
INTRAVENOUS | Status: DC | PRN
  Administered 2012-12-27: 1000 mL
  Administered 2012-12-27: 07:00:00 via INTRAVENOUS

## 2012-12-27 MED ORDER — CELECOXIB 200 MG PO CAPS
200.0000 mg | ORAL_CAPSULE | Freq: Two times a day (BID) | ORAL | Status: DC
  Administered 2012-12-27 – 2012-12-28 (×2): 200 mg via ORAL
  Filled 2012-12-27 (×3): qty 1

## 2012-12-27 MED ORDER — RIVAROXABAN 10 MG PO TABS
10.0000 mg | ORAL_TABLET | ORAL | Status: DC
  Administered 2012-12-28: 10 mg via ORAL
  Filled 2012-12-27 (×2): qty 1

## 2012-12-27 MED ORDER — ONDANSETRON HCL 4 MG PO TABS: 4.0000 mg | ORAL_TABLET | Freq: Four times a day (QID) | ORAL | Status: DC | PRN

## 2012-12-27 MED ORDER — FLEET ENEMA 7-19 GM/118ML RE ENEM: 1.0000 | ENEMA | Freq: Once | RECTAL | Status: AC | PRN

## 2012-12-27 MED ORDER — HYDROMORPHONE HCL PF 1 MG/ML IJ SOLN
0.5000 mg | INTRAMUSCULAR | Status: DC | PRN
  Administered 2012-12-27 (×3): 1 mg via INTRAVENOUS
  Filled 2012-12-27 (×3): qty 1

## 2012-12-27 MED ORDER — MIDAZOLAM HCL 5 MG/5ML IJ SOLN
INTRAMUSCULAR | Status: DC | PRN
  Administered 2012-12-27 (×2): 1 mg via INTRAVENOUS

## 2012-12-27 MED ORDER — SODIUM CHLORIDE 0.9 % IV SOLN
INTRAVENOUS | Status: DC | PRN
  Administered 2012-12-27: 08:00:00 via INTRAVENOUS

## 2012-12-27 MED ORDER — ZOLPIDEM TARTRATE 5 MG PO TABS: 5.0000 mg | ORAL_TABLET | Freq: Every evening | ORAL | Status: DC | PRN

## 2012-12-27 MED ORDER — METOCLOPRAMIDE HCL 10 MG PO TABS: 5.0000 mg | ORAL_TABLET | Freq: Three times a day (TID) | ORAL | Status: DC | PRN

## 2012-12-27 MED ORDER — BISACODYL 10 MG RE SUPP: 10.0000 mg | Freq: Every day | RECTAL | Status: DC | PRN

## 2012-12-27 MED ORDER — ONDANSETRON HCL 4 MG/2ML IJ SOLN: 4.0000 mg | Freq: Four times a day (QID) | INTRAMUSCULAR | Status: DC | PRN

## 2012-12-27 MED ORDER — LACTATED RINGERS IV SOLN: INTRAVENOUS | Status: DC

## 2012-12-27 MED ORDER — PHENYLEPHRINE HCL 10 MG/ML IJ SOLN
INTRAMUSCULAR | Status: DC | PRN
  Administered 2012-12-27 (×2): 40 ug via INTRAVENOUS

## 2012-12-27 MED ORDER — CEFAZOLIN SODIUM-DEXTROSE 2-3 GM-% IV SOLR
INTRAVENOUS | Status: AC
  Filled 2012-12-27: qty 50

## 2012-12-27 MED ORDER — HYDROCODONE-ACETAMINOPHEN 7.5-325 MG PO TABS
1.0000 | ORAL_TABLET | ORAL | Status: DC
  Administered 2012-12-27 (×2): 2 via ORAL
  Administered 2012-12-27: 1 via ORAL
  Administered 2012-12-27 – 2012-12-28 (×3): 2 via ORAL
  Filled 2012-12-27 (×3): qty 2
  Filled 2012-12-27: qty 1
  Filled 2012-12-27 (×2): qty 2

## 2012-12-27 MED ORDER — CEFAZOLIN SODIUM-DEXTROSE 2-3 GM-% IV SOLR
2.0000 g | Freq: Four times a day (QID) | INTRAVENOUS | Status: AC
  Administered 2012-12-27 (×2): 2 g via INTRAVENOUS
  Filled 2012-12-27 (×2): qty 50

## 2012-12-27 MED ORDER — MEPERIDINE HCL 50 MG/ML IJ SOLN: 6.2500 mg | INTRAMUSCULAR | Status: DC | PRN

## 2012-12-27 MED ORDER — 0.9 % SODIUM CHLORIDE (POUR BTL) OPTIME
TOPICAL | Status: DC | PRN
  Administered 2012-12-27: 1000 mL

## 2012-12-27 MED ORDER — FERROUS SULFATE 325 (65 FE) MG PO TABS
325.0000 mg | ORAL_TABLET | Freq: Three times a day (TID) | ORAL | Status: DC
  Administered 2012-12-27 – 2012-12-28 (×2): 325 mg via ORAL
  Filled 2012-12-27 (×6): qty 1

## 2012-12-27 MED ORDER — FENTANYL CITRATE 0.05 MG/ML IJ SOLN
INTRAMUSCULAR | Status: DC | PRN
  Administered 2012-12-27 (×2): 50 ug via INTRAVENOUS

## 2012-12-27 MED ORDER — POLYETHYLENE GLYCOL 3350 17 G PO PACK
17.0000 g | PACK | Freq: Two times a day (BID) | ORAL | Status: DC
  Administered 2012-12-27: 17 g via ORAL

## 2012-12-27 MED ORDER — PHENOL 1.4 % MT LIQD
1.0000 | OROMUCOSAL | Status: DC | PRN
  Filled 2012-12-27: qty 177

## 2012-12-27 MED ORDER — DEXAMETHASONE SODIUM PHOSPHATE 10 MG/ML IJ SOLN
10.0000 mg | Freq: Once | INTRAMUSCULAR | Status: AC
  Administered 2012-12-28: 10 mg via INTRAVENOUS
  Filled 2012-12-27: qty 1

## 2012-12-27 SURGICAL SUPPLY — 61 items
BAG ZIPLOCK 12X15 (MISCELLANEOUS) ×2 IMPLANT
BLADE SAW SGTL 18X1.27X75 (BLADE) ×2 IMPLANT
BRUSH FEMORAL CANAL (MISCELLANEOUS) IMPLANT
CLOTH BEACON ORANGE TIMEOUT ST (SAFETY) ×2 IMPLANT
CUP ACET PNNCL SECTR W/GRIP 56 (Hips) ×1 IMPLANT
DERMABOND ADVANCED (GAUZE/BANDAGES/DRESSINGS) ×1
DERMABOND ADVANCED .7 DNX12 (GAUZE/BANDAGES/DRESSINGS) ×1 IMPLANT
DRAPE INCISE IOBAN 85X60 (DRAPES) ×2 IMPLANT
DRAPE ORTHO SPLIT 77X108 STRL (DRAPES) ×2
DRAPE POUCH INSTRU U-SHP 10X18 (DRAPES) ×2 IMPLANT
DRAPE SURG 17X11 SM STRL (DRAPES) ×2 IMPLANT
DRAPE SURG ORHT 6 SPLT 77X108 (DRAPES) ×2 IMPLANT
DRAPE U-SHAPE 47X51 STRL (DRAPES) ×2 IMPLANT
DRSG AQUACEL AG ADV 3.5X10 (GAUZE/BANDAGES/DRESSINGS) ×2 IMPLANT
DRSG AQUACEL AG ADV 3.5X14 (GAUZE/BANDAGES/DRESSINGS) IMPLANT
DRSG EMULSION OIL 3X16 NADH (GAUZE/BANDAGES/DRESSINGS) IMPLANT
DRSG MEPILEX BORDER 4X4 (GAUZE/BANDAGES/DRESSINGS) IMPLANT
DRSG MEPILEX BORDER 4X8 (GAUZE/BANDAGES/DRESSINGS) IMPLANT
DRSG TEGADERM 4X4.75 (GAUZE/BANDAGES/DRESSINGS) ×2 IMPLANT
DURAPREP 26ML APPLICATOR (WOUND CARE) ×2 IMPLANT
ELECT BLADE TIP CTD 4 INCH (ELECTRODE) ×2 IMPLANT
ELECT REM PT RETURN 9FT ADLT (ELECTROSURGICAL) ×2
ELECTRODE REM PT RTRN 9FT ADLT (ELECTROSURGICAL) ×1 IMPLANT
ELIMINATOR HOLE APEX DEPUY (Hips) ×2 IMPLANT
EVACUATOR 1/8 PVC DRAIN (DRAIN) ×2 IMPLANT
FACESHIELD LNG OPTICON STERILE (SAFETY) ×8 IMPLANT
GAUZE SPONGE 2X2 8PLY STRL LF (GAUZE/BANDAGES/DRESSINGS) ×1 IMPLANT
GLOVE BIOGEL PI IND STRL 7.5 (GLOVE) ×1 IMPLANT
GLOVE BIOGEL PI IND STRL 8 (GLOVE) ×1 IMPLANT
GLOVE BIOGEL PI INDICATOR 7.5 (GLOVE) ×1
GLOVE BIOGEL PI INDICATOR 8 (GLOVE) ×1
GLOVE ECLIPSE 8.0 STRL XLNG CF (GLOVE) ×4 IMPLANT
GOWN BRE IMP PREV XXLGXLNG (GOWN DISPOSABLE) ×6 IMPLANT
GOWN PREVENTION PLUS LG XLONG (DISPOSABLE) ×2 IMPLANT
HANDPIECE INTERPULSE COAX TIP (DISPOSABLE)
HEAD CERAMIC 36 PLUS5 (Hips) ×2 IMPLANT
KIT BASIN OR (CUSTOM PROCEDURE TRAY) ×2 IMPLANT
MANIFOLD NEPTUNE II (INSTRUMENTS) ×2 IMPLANT
NS IRRIG 1000ML POUR BTL (IV SOLUTION) ×4 IMPLANT
PACK TOTAL JOINT (CUSTOM PROCEDURE TRAY) ×2 IMPLANT
PINN SECTOR W/GRIP ACE CUP 56 (Hips) ×2 IMPLANT
PINNACLE ALTRX PLUS 4 N 36X56 (Hips) ×2 IMPLANT
POSITIONER SURGICAL ARM (MISCELLANEOUS) ×2 IMPLANT
PRESSURIZER FEMORAL UNIV (MISCELLANEOUS) IMPLANT
SCREW 6.5MMX25MM (Screw) ×2 IMPLANT
SCREW 6.5MMX30MM (Screw) ×2 IMPLANT
SET HNDPC FAN SPRY TIP SCT (DISPOSABLE) IMPLANT
SPONGE GAUZE 2X2 STER 10/PKG (GAUZE/BANDAGES/DRESSINGS) ×1
SPONGE LAP 18X18 X RAY DECT (DISPOSABLE) IMPLANT
SPONGE LAP 4X18 X RAY DECT (DISPOSABLE) IMPLANT
STAPLER VISISTAT 35W (STAPLE) IMPLANT
SUCTION FRAZIER TIP 10 FR DISP (SUCTIONS) ×2 IMPLANT
SUT MNCRL AB 3-0 PS2 18 (SUTURE) ×2 IMPLANT
SUT VIC AB 1 CT1 36 (SUTURE) ×6 IMPLANT
SUT VIC AB 2-0 CT1 27 (SUTURE) ×3
SUT VIC AB 2-0 CT1 TAPERPNT 27 (SUTURE) ×3 IMPLANT
SUT VLOC 180 0 24IN GS25 (SUTURE) ×4 IMPLANT
TOWEL OR 17X26 10 PK STRL BLUE (TOWEL DISPOSABLE) ×4 IMPLANT
TOWER CARTRIDGE SMART MIX (DISPOSABLE) IMPLANT
TRAY FOLEY CATH 14FRSI W/METER (CATHETERS) ×2 IMPLANT
WATER STERILE IRR 1500ML POUR (IV SOLUTION) ×2 IMPLANT

## 2012-12-27 NOTE — Evaluation (Signed)
Physical Therapy Evaluation Patient Details Name: Isaac [name] MRN: [id number] DOB: 10-17-1955 Today's Date: 12/27/2012 Time: 1535-1610 PT Time Calculation (min): 35 min  PT Assessment / Plan / Recommendation History of Present Illness  per Dr.Olin: Isaac [name]  has presented today for surgery, with the diagnosis of FAILED LEFT TOTAL HIP ARTHROPLASTY  The various methods of treatment have been discussed with the patient and family. After consideration of risks, benefits and other options for treatment, the patient has consented to  Procedure(.  Clinical Impression  Pt with L THA revision presents with decreased ability with all mobility due to decreased strength and ROM> TO benefit from Pt to help transition to home at [facility] level.     PT Assessment  Patient needs continued PT services    Follow Up Recommendations  Home health PT    Does the patient have the potential to tolerate intense rehabilitation      Barriers to Discharge        Equipment Recommendations  3in1 (PT)    Recommendations for Other Services     Frequency 7X/week    Precautions / Restrictions Precautions Precautions: Posterior Hip Precaution Comments: reviewed with pt Posterior Hip precautions.  Restrictions Weight Bearing Restrictions: No (WBAT LLE)   Pertinent Vitals/Pain 5/10      Mobility  Bed Mobility Bed Mobility: Supine to Sit;Sit to Supine Supine to Sit: 4: Min assist;With rails Sit to Supine: Not Tested (comment) Details for Bed Mobility Assistance: cues for sequencing and assist with LE and scooting Transfers Transfers: Sit to Stand;Stand to Sit Sit to Stand: 4: Min assist;With upper extremity assist;From elevated surface Stand to Sit: 4: Min assist;With upper extremity assist Details for Transfer Assistance: cues fror safe use of rW and sequencing for hip precautions Ambulation/Gait Ambulation/Gait Assistance: 4: Min guard Ambulation Distance (Feet): 10 Feet Assistive device:  Rolling walker Ambulation/Gait Assistance Details: cues for sequencing Gait Pattern: Step-to pattern Stairs: No    Exercises Total Joint Exercises Ankle Circles/Pumps: AROM;Left;5 reps;Supine Quad Sets: AAROM;Left;Supine;5 reps Heel Slides: AAROM;Left;5 reps;Supine   PT Diagnosis: Difficulty walking  PT Problem List: Decreased strength;Decreased activity tolerance;Decreased mobility PT Treatment Interventions: DME instruction;Gait training;Stair training;Therapeutic activities;Therapeutic exercise;Functional mobility training;Patient/family education     PT Goals(Current goals can be found in the care plan section) Acute Rehab PT Goals Patient Stated Goal: to returm home soon PT Goal Formulation: With patient Time For Goal Achievement: 01/10/13 Potential to Achieve Goals: Good  Visit Information  Last PT Received On: 12/27/12 History of Present Illness: per Dr.Olin: Isaac [name]  has presented today for surgery, with the diagnosis of FAILED LEFT TOTAL HIP ARTHROPLASTY  The various methods of treatment have been discussed with the patient and family. After consideration of risks, benefits and other options for treatment, the patient has consented to  Procedure(.       Prior Functioning  Home Living Family/patient expects to be discharged to:: Private residence Living Arrangements: Spouse/significant other Available Help at Discharge: Family Type of Home: House Home Access: Stairs to enter Secretary/administrator of Steps: 1+1 Home Layout: One level Home Equipment: Environmental consultant - 2 wheels Additional Comments: will need commode chair/ 3n1 Prior Function Level of Independence: Independent Communication Communication: No difficulties    Cognition  Cognition Arousal/Alertness: Awake/alert Behavior During Therapy: WFL for tasks assessed/performed Overall Cognitive Status: Within Functional Limits for tasks assessed    Extremity/Trunk Assessment Lower Extremity Assessment Lower  Extremity Assessment: LLE deficits/detail LLE Deficits / Details: limited due to surgery but  able to perform quad set adn heel slide with some ease  LLE: Unable to fully assess due to pain   Balance    End of Session PT - End of Session Equipment Utilized During Treatment: Gait belt Activity Tolerance: Patient tolerated treatment well Patient left: in chair;with family/visitor present Nurse Communication: Mobility status  GP     Isaac [name] 12/27/2012, 10:18 PM Isaac [name], PT Pager: [phone] 12/27/2012

## 2012-12-27 NOTE — Interval H&P Note (Signed)
History and Physical Interval Note:  12/27/2012 7:10 AM  Isaac [name]  has presented today for surgery, with the diagnosis of FAILED LEFT TOTAL HIP ARTHROPLASTY  The various methods of treatment have been discussed with the patient and family. After consideration of risks, benefits and other options for treatment, the patient has consented to  Procedure(s): LEFT TOTAL HIP ARTHROPLASTY REVISION (Left) as a surgical intervention .  The patient's history has been reviewed, patient examined, no change in status, stable for surgery.  I have reviewed the patient's chart and labs.  Questions were answered to the patient's satisfaction.     Shelda Pal

## 2012-12-27 NOTE — Op Note (Signed)
Isaac [name], Isaac [name]               ACCOUNT NO.:  [id number]  MEDICAL RECORD NO.:  [id number]  LOCATION:  1617                         FACILITY:  [facility]  PHYSICIAN:  Madlyn Frankel. Charlann Boxer, M.D.  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  PROCEDURE:  Revision of left total hip arthroplasty utilize in a size 56 Gription sector, pinnacle cup, a 36+4 neutral liner, and a 36+5 Delta ceramic ball.  SURGEON:  Madlyn Frankel. Charlann Boxer, M.D.  ASSISTANT:  Lanney Gins, PA-C.  Of note, Isaac [name] was present for the entirety of the case from preoperative position, perioperative management, operative extremity, general facilitation case facilitation case from primary wound closure.  ANESTHESIA:  General.  DRAINS:  None.  COMPLICATIONS:  None.  SPECIMENS:  The patient's femoral head and acetabular shell were sent to Pathology for routine analysis prior to returning the patient for litigation purposes.  FINDINGS:  The patient noted to have a joint knee fusion.  No signs of purulence.  There was evidence of metal staining of the synovium around the joint.  INDICATIONS FOR PROCEDURE:  Isaac [name] is a 57 year old gentleman with a remote history of a left total hip arthroplasty with a DePuy ASR hip. He has reported on and off discomfort with progressive worsening over the last 18 months to 2 years.  He has had no fevers, chills, night sweats workup was negative for infection.  His workup for metallosis systemically was relatively normal with MRI that did not indicate large fluid collection.  The posterior aspect of the hip and his serum cobalt and chromium levels were mildly elevated at  best.  These findings can be reviewed through our office notes.  Based on this, the clinical decision was made to the discussion Isaac [name] was persistent pain to proceed with revision surgery and given the fact that there was no specific evidence of systemic or local issues.  I counseled him on the risk of having persistent discomfort, though I have had the experience and related this to him and had many folks had persistent pain with findings intraoperative not consistent with the preoperative workup.  After reviewing these risks and skin including risk of infection, DVT, component failure, need for future revision surgery, including the increased risk of dislocation and revision of hip surgery standpoint.  Consent was obtained for benefit of pain relief.  PROCEDURE IN DETAIL:  The patient was brought to operative theater. Once adequate anesthesia, preoperative antibiotics, Ancef administered. The patient was positioned into the right lateral decubitus position with left side up.  The left lower extremity was then prepped and draped in sterile fashion.  Time-out was performed identifying the patient, planned procedure, extremity.  The patient's old incision was identified and utilized extended slightly distal.  Soft tissue planes created and then the iliotibial band and gluteal fascia were then split for posterior approach to the hip.  At this point, attention  was first directed to preparation of the posterior inner dissection to the posterior aspect of the hip.  The hip joint was exposed encountering a synovial fluid.  The hip appeared to be a bit metal stained and inflamed, but no signs of purulence.  I spent time exposing the posterior 2/3 of the acetabulum removing scar tissue.  Once this was done, I was able to dislocate the hip, removed the femoral head and extending off to the back table for eventual submission to pathology.  With the femoral head removed, I was able to  further debride the synovium that was densely metal stained as well scarring around the hip. With retractors placed, I was able to expose the acetabulum and using the explant system, we used a 47 and large ball and placed it on the starter cutting blade and removed the acetabular shell without bone loss.  At this point, I reamed with a 54 reamer and then a 55 reamer and based on the remaining bone stock, it was decided not to ream further. I selected a 56 Gription pinnacle cup and this was impacted using a curved impactor.  Once it was impacted with good initial scratch fit.  I confirmed the position was approximately 20 degrees of forward flexion, and 35-40 degrees of abduction.  Two cancellous screws were placed in the ilium the support initial scratch fit.  The hole eliminator was placed and 36+4 neutral liner was selected.  I did a trial reduction with a 36+5 ball.  With this, I found the combined anteversion to be about 50 degrees.  There was no evidence of impingement or subluxation with forward flexion, internal rotation, or extension external rotation.  Leg lengths appeared to be similar.  At this point, given these this trial reduction, the hip was dislocated. The trial ball was removed and the final 36+5 Delta ceramic ball was impacted on a clean and dry trunnion.  The hip was then reduced.  We irrigated the hip throughout the case.  Then, there was no significant posterior structures to reapproximate.  No Hemovac drain was used.  At this point, the iliotibial band and gluteal fascia were reapproximated using combination of #1 Vicryl and 0 V-Loc sutures.  The remainder of the wound was closed with 2-0 Vicryl and running 3-0 Monocryl.  The hip was cleaned, dried, and utilized using a Aquacel dressing.  The patient was then brought to recovery room in stable condition tolerating the procedure well on regional Niese function.     Madlyn Frankel Charlann Boxer, M.D.     MDO/MEDQ  D:   12/27/2012  T:  12/27/2012  Job:  [id number]

## 2012-12-27 NOTE — Anesthesia Postprocedure Evaluation (Signed)
  Anesthesia Post-op Note  Patient: Isaac [name]  Procedure(s) Performed: Procedure(s) (LRB): LEFT TOTAL HIP ARTHROPLASTY REVISION (Left)  Patient Location: PACU  Anesthesia Type: Spinal  Level of Consciousness: awake and alert   Airway and Oxygen Therapy: Patient Spontanous Breathing  Post-op Pain: mild  Post-op Assessment: Post-op Vital signs reviewed, Patient's Cardiovascular Status Stable, Respiratory Function Stable, Patent Airway and No signs of Nausea or vomiting  Last Vitals:  Filed Vitals:   12/27/12 1020  BP: 124/78  Pulse: 52  Temp: 36.4 C  Resp: 14    Post-op Vital Signs: stable   Complications: No apparent anesthesia complications

## 2012-12-27 NOTE — Brief Op Note (Signed)
12/27/2012  8:52 AM  PATIENT:  Isaac [name]  57 y.o. male  PRE-OPERATIVE DIAGNOSIS:  FAILED LEFT TOTAL HIP ARTHROPLASTY, metallosis  POST-OPERATIVE DIAGNOSIS: FAILED LEFT TOTAL HIP ARTHROPLASTY, metallosis  PROCEDURE:  Procedure(s): LEFT TOTAL HIP ARTHROPLASTY REVISION (Left)  Depuy 56mm cup, 36+4, 36+5 ceramic ball  SURGEON:  Surgeon(s) and Role:    * Shelda Pal, MD - Primary  PHYSICIAN ASSISTANT: Lanney Gins, PA-C  ANESTHESIA:   general  EBL:  Total I/O In: -  Out: 675 [Urine:350; Blood:225]  BLOOD ADMINISTERED:none  DRAINS: none   LOCAL MEDICATIONS USED:  NONE  SPECIMEN:  Source of Specimen:  Left hip components sent to pathology for routine analysis prior to being returned to patient  DISPOSITION OF SPECIMEN:  PATHOLOGY  COUNTS:  YES  TOURNIQUET:  * No tourniquets in log *  DICTATION: .Other Dictation: Dictation Number [id number]  PLAN OF CARE: Admit to inpatient   PATIENT DISPOSITION:  PACU - hemodynamically stable.   Delay start of Pharmacological VTE agent (>24hrs) due to surgical blood loss or risk of bleeding: no

## 2012-12-27 NOTE — Preoperative (Signed)
Beta Blockers   Reason not to administer Beta Blockers:Not Applicable 

## 2012-12-27 NOTE — Transfer of Care (Signed)
Immediate Anesthesia Transfer of Care Note  Patient: Isaac [name]  Procedure(s) Performed: Procedure(s): LEFT TOTAL HIP ARTHROPLASTY REVISION (Left)  Patient Location: PACU  Anesthesia Type:Spinal  Level of Consciousness: awake, oriented, patient cooperative, lethargic and responds to stimulation  Airway & Oxygen Therapy: Patient Spontanous Breathing and Patient connected to face mask oxygen  Post-op Assessment: Report given to PACU RN and Post -op Vital signs reviewed and stable  Post vital signs: Reviewed and stable  Complications: No apparent anesthesia complications

## 2012-12-27 NOTE — Anesthesia Preprocedure Evaluation (Addendum)
Anesthesia Evaluation  Patient identified by MRN, date of birth, ID band Patient awake    Reviewed: Allergy & Precautions, H&P , NPO status , Patient's Chart, lab work & pertinent test results  Airway Mallampati: II TM Distance: >3 FB Neck ROM: Full    Dental no notable dental hx.    Pulmonary PE breath sounds clear to auscultation  Pulmonary exam normal       Cardiovascular negative cardio ROS  Rhythm:Regular Rate:Normal     Neuro/Psych negative neurological ROS  negative psych ROS   GI/Hepatic negative GI ROS, Neg liver ROS,   Endo/Other  negative endocrine ROS  Renal/GU negative Renal ROS  negative genitourinary   Musculoskeletal negative musculoskeletal ROS (+)   Abdominal   Peds negative pediatric ROS (+)  Hematology negative hematology ROS (+)   Anesthesia Other Findings   Reproductive/Obstetrics negative OB ROS                          Anesthesia Physical Anesthesia Plan  ASA: II  Anesthesia Plan: Spinal   Post-op Pain Management:    Induction:   Airway Management Planned: Simple Face Mask  Additional Equipment:   Intra-op Plan:   Post-operative Plan:   Informed Consent: I have reviewed the patients History and Physical, chart, labs and discussed the procedure including the risks, benefits and alternatives for the proposed anesthesia with the patient or authorized representative who has indicated his/her understanding and acceptance.   Dental advisory given  Plan Discussed with: CRNA  Anesthesia Plan Comments:         Anesthesia Quick Evaluation

## 2012-12-27 NOTE — Anesthesia Procedure Notes (Signed)
Spinal  Patient location during procedure: OR Staffing Anesthesiologist: Bessie Boyte Performed by: anesthesiologist  Preanesthetic Checklist Completed: patient identified, site marked, surgical consent, pre-op evaluation, timeout performed, IV checked, risks and benefits discussed and monitors and equipment checked Spinal Block Patient position: sitting Prep: Betadine Patient monitoring: heart rate, continuous pulse ox and blood pressure Approach: right paramedian Location: L2-3 Injection technique: single-shot Needle Needle type: Sprotte  Needle gauge: 24 G Needle length: 9 cm Additional Notes Expiration date of kit checked and confirmed. Patient tolerated procedure well, without complications.     

## 2012-12-27 NOTE — Transfer of Care (Signed)
Immediate Anesthesia Transfer of Care Note  Patient: Isaac [name]  Procedure(s) Performed: Procedure(s): LEFT TOTAL HIP ARTHROPLASTY REVISION (Left)  Patient Location: PACU  Anesthesia Type:Spinal  Level of Consciousness: awake, oriented, patient cooperative and lethargic  Airway & Oxygen Therapy: Patient Spontanous Breathing and Patient connected to face mask oxygen  Post-op Assessment: Report given to PACU RN, Post -op Vital signs reviewed and stable and Patient moving all extremities  Post vital signs: Reviewed and stable  Complications: No apparent anesthesia complications

## 2012-12-27 NOTE — Progress Notes (Signed)
[facility]   [facility] is providing the following services: Commode (patient already has rw)  If patient discharges after hours, please call [phone].   Renard Hamper 12/27/2012, 4:55 PM

## 2012-12-28 ENCOUNTER — Encounter (HOSPITAL_COMMUNITY): Payer: Self-pay | Admitting: Orthopedic Surgery

## 2012-12-28 LAB — CBC
MCHC: 33.6 g/dL (ref 30.0–36.0)
MCV: 84.1 fL (ref 78.0–100.0)
Platelets: 175 10*3/uL (ref 150–400)
RDW: 14.2 % (ref 11.5–15.5)
WBC: 8 10*3/uL (ref 4.0–10.5)

## 2012-12-28 LAB — BASIC METABOLIC PANEL
BUN: 11 mg/dL (ref 6–23)
Calcium: 8.5 mg/dL (ref 8.4–10.5)
Chloride: 104 mEq/L (ref 96–112)
Creatinine, Ser: 1.17 mg/dL (ref 0.50–1.35)
GFR calc Af Amer: 79 mL/min — ABNORMAL LOW (ref >90)
GFR calc non Af Amer: 69 mL/min — ABNORMAL LOW (ref >90)

## 2012-12-28 MED ORDER — METHOCARBAMOL 500 MG PO TABS: 500.0000 mg | ORAL_TABLET | Freq: Four times a day (QID) | ORAL | Status: DC | PRN

## 2012-12-28 MED ORDER — FERROUS SULFATE 325 (65 FE) MG PO TABS: 325.0000 mg | ORAL_TABLET | Freq: Three times a day (TID) | ORAL | Status: DC

## 2012-12-28 MED ORDER — DSS 100 MG PO CAPS: 100.0000 mg | ORAL_CAPSULE | Freq: Two times a day (BID) | ORAL | Status: DC

## 2012-12-28 MED ORDER — HYDROCODONE-ACETAMINOPHEN 7.5-325 MG PO TABS: 1.0000 | ORAL_TABLET | ORAL | Status: DC | PRN

## 2012-12-28 MED ORDER — RIVAROXABAN 10 MG PO TABS: 10.0000 mg | ORAL_TABLET | ORAL | Status: DC

## 2012-12-28 MED ORDER — POLYETHYLENE GLYCOL 3350 17 G PO PACK: 17.0000 g | PACK | Freq: Two times a day (BID) | ORAL | Status: DC

## 2012-12-28 MED ORDER — ASPIRIN EC 325 MG PO TBEC: 325.0000 mg | DELAYED_RELEASE_TABLET | Freq: Two times a day (BID) | ORAL | Status: AC

## 2012-12-28 NOTE — Progress Notes (Signed)
Physical Therapy Treatment Patient Details Name: Isaac [name] MRN: [id number] DOB: Aug 15, 1955 Today's Date: 12/28/2012 Time: 4098-1191 PT Time Calculation (min): 28 min  PT Assessment / Plan / Recommendation  History of Present Illness per Dr.Olin: Isaac [name]  has presented today for surgery, with the diagnosis of FAILED LEFT TOTAL HIP ARTHROPLASTY  The various methods of treatment have been discussed with the patient and family. After consideration of risks, benefits and other options for treatment, the patient has consented to  Procedure(.   PT Comments   POD # 1 am session pt planning to D/C to home today.  Spouse present during session and was educated on safe handling, THP, HEP and steps (up 2 backward 2nd no rails).  Pt also instructed on use of ICE and proper positioning.    Follow Up Recommendations  Home health PT     Does the patient have the potential to tolerate intense rehabilitation     Barriers to Discharge        Equipment Recommendations  3in1 (PT)    Recommendations for Other Services    Frequency 7X/week   Progress towards PT Goals Progress towards PT goals: Progressing toward goals  Plan      Precautions / Restrictions Precautions Precautions: Posterior Hip Precaution Comments: reviewed with pt Posterior Hip precautions.  Restrictions Weight Bearing Restrictions: No Other Position/Activity Restrictions: WBAT    Pertinent Vitals/Pain C/o "soreness" ICE applied    Mobility  Bed Mobility Bed Mobility: Not assessed Details for Bed Mobility Assistance: Pt sitting EOB on arrival Transfers Transfers: Sit to Stand;Stand to Sit Sit to Stand: 5: Supervision;From bed Stand to Sit: 5: Supervision;To chair/3-in-1 Details for Transfer Assistance: one VC to avois hip flex >90' Ambulation/Gait Ambulation/Gait Assistance: 5: Supervision;4: Min guard Ambulation Distance (Feet): 85 Feet Assistive device: Rolling walker Ambulation/Gait Assistance Details:  with spouse pt amb in hallway with 25% VC's on proper L LE placement and proper walker to self placement to avoid posterior LOB and steeping too far to the front of the walker.  Gait Pattern: Step-to pattern Gait velocity: decreased     PT Goals (current goals can now be found in the care plan section)    Visit Information  Last PT Received On: 12/28/12 Assistance Needed: +1 History of Present Illness: per Dr.Olin: Isaac [name]  has presented today for surgery, with the diagnosis of FAILED LEFT TOTAL HIP ARTHROPLASTY  The various methods of treatment have been discussed with the patient and family. After consideration of risks, benefits and other options for treatment, the patient has consented to  Procedure(.    Subjective Data      Cognition       Balance     End of Session PT - End of Session Equipment Utilized During Treatment: Gait belt Activity Tolerance: Patient tolerated treatment well Patient left: in chair;with family/visitor present   Isaac [name]  PTA [facility]  Acute  Rehab Pager      [phone]

## 2012-12-28 NOTE — Progress Notes (Signed)
   Subjective: 1 Day Post-Op Procedure(s) (LRB): LEFT TOTAL HIP ARTHROPLASTY REVISION (Left)   Patient reports pain as mild, pain well controlled. N events throughout the night. Felt he did well with PT yesterday. Ready to be discharged home.  Objective:   VITALS:   Filed Vitals:   12/28/12 0234  BP: 119/71  Pulse: 74  Temp: 98.7 F (37.1 C)  Resp: 16    Neurovascular intact Dorsiflexion/Plantar flexion intact Incision: dressing C/D/I No cellulitis present Compartment soft  LABS  Recent Labs  12/28/12 0509  HGB 13.5  HCT 40.2  WBC 8.0  PLT 175     Recent Labs  12/28/12 0509  NA 137  K 3.9  BUN 11  CREATININE 1.17  GLUCOSE 109*     Assessment/Plan: 1 Day Post-Op Procedure(s) (LRB): LEFT TOTAL HIP ARTHROPLASTY REVISION (Left) Foley cath d/c'ed Advance diet Up with therapy D/C IV fluids Discharge home with home health     Anastasio Auerbach. Iola Turri   PAC  12/28/2012, 7:23 AM

## 2012-12-28 NOTE — Care Management Note (Signed)
    Page 1 of 2   12/28/2012     11:45:57 AM   CARE MANAGEMENT NOTE 12/28/2012  Patient:  Isaac [name], Isaac [name]   Account Number:  [id number]  Date Initiated:  12/28/2012  Documentation initiated by:  Colleen Can  Subjective/Objective Assessment:   DX FAiled left total hip arthroplasty; revision hip arthroplasty.    PCP-Dr. Mirna Mires  Pharmacy-[facility] [address]  Has prescription coverage     Action/Plan:   CM spoke wth patient. Plans are for patient to retun to his home in [place] where frien will be caregiver. He has RW. 3N1 has been delivered to his rm.   Anticipated DC Date:  12/28/2012   Anticipated DC Plan:  HOME W HOME HEALTH SERVICES      DC Planning Services  CM consult      [facility] Choice  HOME HEALTH  DURABLE MEDICAL EQUIPMENT   Choice offered to / List presented to:  C-1 Patient   DME arranged  3-N-1      DME agency  [facility]     HH arranged  HH-2 PT      [facility] agency  [facility]   Status of service:  Completed, signed off Medicare Important Message given?   (If response is "NO", the following Medicare IM given date fields will be blank) Date Medicare IM given:   Date Additional Medicare IM given:    Discharge Disposition:  HOME W HOME HEALTH SERVICES  Per UR Regulation:  Reviewed for med. necessity/level of care/duration of stay  If discussed at Long Length of Stay Meetings, dates discussed:    Comments:  12/28/2012 Colleen Can BSN RN CCM [phone] Genevieve Norlander will provide [facility] services with start date within 48hrs of discharge. Pt has contact information for [facility]. Pt voices understanding on contact for [facility] services.

## 2012-12-31 NOTE — Discharge Summary (Signed)
Physician Discharge Summary  Patient ID: Isaac [name] MRN: [id number] DOB/AGE: August 09, 1955 57 y.o.  Admit date: 12/27/2012 Discharge date: 12/28/2012   Procedures:  Procedure(s) (LRB): LEFT TOTAL HIP ARTHROPLASTY REVISION (Left)  Attending Physician:  Dr. 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 History of blood clots     lt lung  . PE (pulmonary embolism) 2008    PE SEVERAL MONTHS AFTER HIP REPLACEMENT SURGERY  . GERD (gastroesophageal reflux disease)     ONE SEVERE EPISODE - WENT TO  03/16/12.   NOT ON ANY MEDS AT PRESENT  . Arthritis     HX OA LEFT HIP  . Pain     PAIN LEFT HIP, "LOCKS UP SOMETIMES"  -HAS FAILED LEFT TOTAL HIP ARTHROPLASTY - METALOSIS    HPI: Isaac [name], 57 y.o. male, has a history of pain and functional disability in the left hip due to previous metal-on-metal total hip arthroplasty. The indications for the revision total hip arthroplasty are pain and increased metal ion levels in the blood. Onset of symptoms was gradual starting 4 years ago with rapidlly worsening course since that time. Prior procedures on the left hip include arthroplasty. Patient currently rates pain in the left hip at 10 out of 10 with activity. There is night pain, worsening of pain with activity and weight bearing, trendelenberg gait, pain that interfers with activities of daily living and pain with passive range of motion. Patient has evidence of previous metal-on-metal total hip arthroplasty by imaging studies. This condition presents safety issues increasing the risk of falls. There is no current active signs of infection. Risks, benefits and expectations were discussed with the patient. Patient understand the risks, benefits and expectations and wishes to proceed with surgery.   PCP: Evlyn Courier, MD   Discharged  Condition: good  Hospital Course:  Patient underwent the above stated procedure on 12/27/2012. Patient tolerated the procedure well and brought to the recovery room in good condition and subsequently to the floor.  POD #1 BP: 119/71 ; Pulse: 74 ; Temp: 98.7 F (37.1 C) ; Resp: 16  Pt's foley was removed. Patient reports pain as mild, pain well controlled. N events throughout the night. Felt he did well with PT yesterday. Ready to be discharged home. Neurovascular intact, dorsiflexion/plantar flexion intact, incision: dressing C/D/I, no cellulitis present and compartment soft.   LABS  Basename    HGB  13.5  HCT  40.2    Discharge Exam: General appearance: alert, cooperative and no distress Extremities: Homans sign is negative, no sign of DVT, no edema, redness or tenderness in the calves or thighs and no ulcers, gangrene or trophic changes  Disposition:    Home-Health Care Svc with follow up in 2 weeks   Follow-up Information   Follow up with Shelda Pal, MD. Schedule an appointment as soon as possible for a visit in 2 weeks.   Specialty:  Orthopedic Surgery   Contact information:   [address] [place] [address] [phone]       Discharge Orders   Future Orders Complete By Expires   Call MD / Call 911  As directed    Comments:     If you experience chest pain or shortness of breath, CALL 911 and be transported to the hospital emergency room.  If  you develope a fever above 101 F, pus (white drainage) or increased drainage or redness at the wound, or calf pain, call your surgeon's office.   Change dressing  As directed    Comments:     Maintain surgical dressing for 10-14 days, then replace with 4x4 guaze and tape. Keep the area dry and clean.   Constipation Prevention  As directed    Comments:     Drink plenty of fluids.  Prune juice may be helpful.  You may use a stool softener, such as Colace (over the counter) 100 mg twice a day.  Use MiraLax  (over the counter) for constipation as needed.   Diet - low sodium heart healthy  As directed    Discharge instructions  As directed    Comments:     Maintain surgical dressing for 10-14 days, then replace with gauze and tape. Keep the area dry and clean until follow up. Follow up in 2 weeks at [facility]. Call with any questions or concerns.   Increase activity slowly as tolerated  As directed    TED hose  As directed    Comments:     Use stockings (TED hose) for 2 weeks on both leg(s).  You may remove them at night for sleeping.   Weight bearing as tolerated  As directed         Medication List    STOP taking these medications       ibuprofen 200 MG tablet  Commonly known as:  ADVIL,MOTRIN     naproxen sodium 220 MG tablet  Commonly known as:  ANAPROX      TAKE these medications       aspirin EC 325 MG tablet  Take 1 tablet (325 mg total) by mouth 2 (two) times daily. Take for 4 weeks.     DSS 100 MG Caps  Take 100 mg by mouth 2 (two) times daily.     ferrous sulfate 325 (65 FE) MG tablet  Take 1 tablet (325 mg total) by mouth 3 (three) times daily after meals.     HYDROcodone-acetaminophen 7.5-325 MG per tablet  Commonly known as:  NORCO  Take 1-2 tablets by mouth every 4 (four) hours as needed for pain.     methocarbamol 500 MG tablet  Commonly known as:  ROBAXIN  Take 1 tablet (500 mg total) by mouth every 6 (six) hours as needed (muscle spasms).     OVER THE COUNTER MEDICATION  Take 1 tablet by mouth as needed. Medication: over the counter herbal "male enhancement" product     polyethylene glycol packet  Commonly known as:  MIRALAX / GLYCOLAX  Take 17 g by mouth 2 (two) times daily.     rivaroxaban 10 MG Tabs tablet  Commonly known as:  XARELTO  Take 1 tablet (10 mg total) by mouth daily.         Signed: Anastasio Auerbach. Kristopher Delk   PAC  12/31/2012, 9:30 AM

## 2014-04-26 ENCOUNTER — Ambulatory Visit: Payer: Self-pay | Admitting: Podiatry

## 2014-05-04 ENCOUNTER — Ambulatory Visit (INDEPENDENT_AMBULATORY_CARE_PROVIDER_SITE_OTHER): Payer: 59 | Admitting: Podiatrist

## 2014-05-04 ENCOUNTER — Ambulatory Visit (INDEPENDENT_AMBULATORY_CARE_PROVIDER_SITE_OTHER): Payer: 59

## 2014-05-04 ENCOUNTER — Encounter: Payer: Self-pay | Admitting: Podiatrist

## 2014-05-04 VITALS — BP 130/90 | HR 80 | Resp 16

## 2014-05-04 DIAGNOSIS — L923 Foreign body granuloma of the skin and subcutaneous tissue: Secondary | ICD-10-CM

## 2014-05-04 DIAGNOSIS — M79673 Pain in unspecified foot: Secondary | ICD-10-CM

## 2014-05-04 MED ORDER — NAFTIFINE HCL 2 % EX CREA: 1.0000 "application " | TOPICAL_CREAM | Freq: Every day | CUTANEOUS | Status: DC

## 2014-05-04 MED ORDER — HYDROCORTISONE BUTYRATE 0.1 % EX CREA: 1.0000 "application " | TOPICAL_CREAM | Freq: Every day | CUTANEOUS | Status: DC

## 2014-05-04 NOTE — Progress Notes (Signed)
   Subjective:    Patient ID: Isaac [name], male    DOB: 08-10-1955, 59 y.o.   MRN: [id number]  HPI Comments: "I got something in my foot"  Patient c/o tender plantar heel right for 2 weeks. He stepped on a piece of wood and now has a splinter. He has been trying to dig out but has been unsuccessful.   Patient also has an area dorsal forefoot left that is discolored, lighter than the rest of his skin, that he states is a fungus. Says it itches occasionally.     Review of Systems  All other systems reviewed and are negative.      Objective:   Physical Exam Patient is awake, alert, and oriented x 3.  In no acute distress.  Vascular status is intact with palpable pedal pulses at 2/4 DP and PT bilateral and capillary refill time within normal limits. Neurological sensation is also intact bilaterally via Semmes Weinstein monofilament at 5/5 sites. Light touch, vibratory sensation, Achilles tendon reflex is intact. Dermatological exam reveals a very smaller area of darkened discoloration that appears to be an entry wound for a foreign body on the right heel. No redness, no swelling, no drainage, no sign of infection is noted. Dorsal aspect of the left foot has an area of apparent excoriation with skin discoloration present and itching subjectively reported. No redness, no swelling, no sign of infection is present here as well.  Musculature intact with dorsiflexion, plantarflexion, inversion, eversion.    Assessment & Plan:  Foreign body right heel, dermatitis dorsum left foot  Plan: With able to remove the foreign body which ended up being a shard of glass from the right heel without complication. Recommended aftercare with Epsom salt soaks and antibiotic cream. Also recommended Naftin cream and Locoid lotion to be used on the top of the left foot for the rash. He will call if this does not improve with this therapy.

## 2014-05-04 NOTE — Patient Instructions (Signed)
The combination steroid/antifungal cream is not a good price through your insurance-- Instead I have prescribed 2 medications to put on the top of your foot-- one is a antifungal and the other is a steroid.. Use one in the morning and one in the evening.

## 2014-10-19 IMAGING — CR DG HIP 1V PORT*L*
1 series · 1 of 1 positions shown · non-contrast
Comparison: CT 02/02/2009

CLINICAL DATA: Left hip revision

PORTABLE LEFT HIP - 1 VIEW

[shoot- thru lateral]
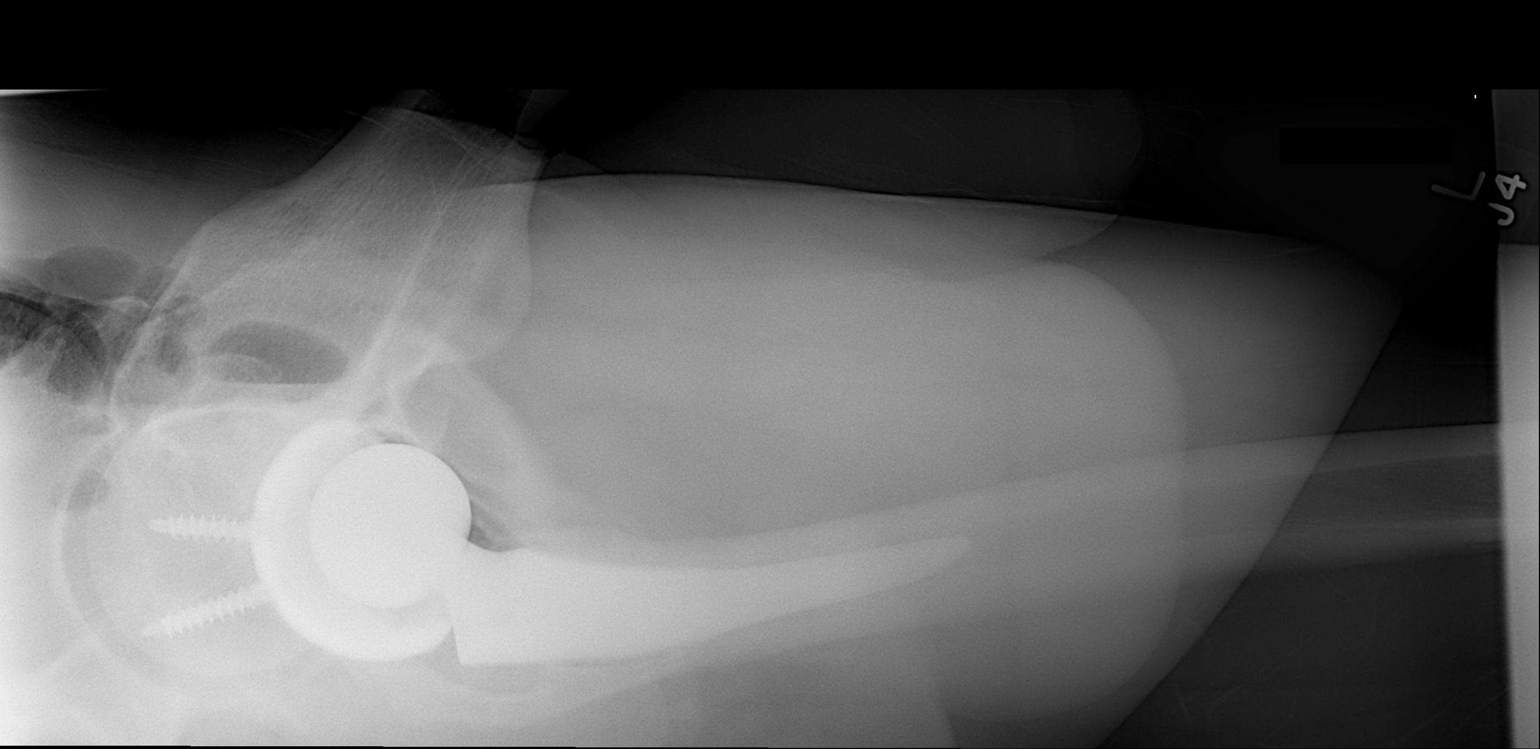

[1 of 1 positions shown; findings below may reference images not displayed]

FINDINGS: Femoral and acetabular components of left hip
arthroplasty project in expected location.  Negative for fracture
or dislocation.
IMPRESSION: Left hip arthroplasty revision without apparent complication.

## 2017-02-11 DIAGNOSIS — Z23 Encounter for immunization: Secondary | ICD-10-CM | POA: Diagnosis not present

## 2017-02-11 DIAGNOSIS — M25552 Pain in left hip: Secondary | ICD-10-CM | POA: Diagnosis not present

## 2017-02-11 DIAGNOSIS — I10 Essential (primary) hypertension: Secondary | ICD-10-CM | POA: Diagnosis not present

## 2017-05-28 DIAGNOSIS — H1033 Unspecified acute conjunctivitis, bilateral: Secondary | ICD-10-CM | POA: Diagnosis not present

## 2017-06-22 DIAGNOSIS — Z1211 Encounter for screening for malignant neoplasm of colon: Secondary | ICD-10-CM | POA: Diagnosis not present

## 2017-06-28 DIAGNOSIS — J1089 Influenza due to other identified influenza virus with other manifestations: Secondary | ICD-10-CM | POA: Diagnosis not present

## 2017-06-30 DIAGNOSIS — I10 Essential (primary) hypertension: Secondary | ICD-10-CM | POA: Diagnosis not present

## 2017-06-30 DIAGNOSIS — J069 Acute upper respiratory infection, unspecified: Secondary | ICD-10-CM | POA: Diagnosis not present

## 2017-07-28 DIAGNOSIS — Z1211 Encounter for screening for malignant neoplasm of colon: Secondary | ICD-10-CM | POA: Diagnosis not present

## 2018-10-13 ENCOUNTER — Other Ambulatory Visit: Payer: Self-pay

## 2018-10-13 DIAGNOSIS — Z20822 Contact with and (suspected) exposure to covid-19: Secondary | ICD-10-CM

## 2018-10-19 LAB — NOVEL CORONAVIRUS, NAA: SARS-CoV-2, NAA: NOT DETECTED

## 2019-12-08 ENCOUNTER — Other Ambulatory Visit: Payer: Self-pay

## 2019-12-08 DIAGNOSIS — Z20822 Contact with and (suspected) exposure to covid-19: Secondary | ICD-10-CM

## 2019-12-10 LAB — NOVEL CORONAVIRUS, NAA: SARS-CoV-2, NAA: NOT DETECTED

## 2019-12-10 LAB — SARS-COV-2, NAA 2 DAY TAT

## 2020-07-18 ENCOUNTER — Emergency Department (HOSPITAL_COMMUNITY)
Admission: EM | Admit: 2020-07-18 | Discharge: 2020-07-18 | Disposition: A | Discharge status: Home / Self Care | Payer: 59 | Attending: Emergency Medicine | Admitting: Emergency Medicine

## 2020-07-18 ENCOUNTER — Encounter (HOSPITAL_COMMUNITY): Payer: Self-pay | Admitting: Emergency Medicine

## 2020-07-18 ENCOUNTER — Other Ambulatory Visit: Payer: Self-pay

## 2020-07-18 DIAGNOSIS — Z96642 Presence of left artificial hip joint: Secondary | ICD-10-CM | POA: Insufficient documentation

## 2020-07-18 DIAGNOSIS — U071 COVID-19: Secondary | ICD-10-CM | POA: Diagnosis not present

## 2020-07-18 DIAGNOSIS — R509 Fever, unspecified: Secondary | ICD-10-CM | POA: Diagnosis present

## 2020-07-18 DIAGNOSIS — Z87891 Personal history of nicotine dependence: Secondary | ICD-10-CM | POA: Diagnosis not present

## 2020-07-18 LAB — COMPREHENSIVE METABOLIC PANEL
ALT: 26 U/L (ref 0–44)
AST: 21 U/L (ref 15–41)
Albumin: 3.7 g/dL (ref 3.5–5.0)
Alkaline Phosphatase: 41 U/L (ref 38–126)
Anion gap: 8 (ref 5–15)
BUN: 11 mg/dL (ref 8–23)
CO2: 26 mmol/L (ref 22–32)
Calcium: 8.7 mg/dL — ABNORMAL LOW (ref 8.9–10.3)
Chloride: 105 mmol/L (ref 98–111)
Creatinine, Ser: 1.22 mg/dL (ref 0.61–1.24)
GFR, Estimated: >60 mL/min (ref >60)
Glucose, Bld: 116 mg/dL — ABNORMAL HIGH (ref 70–99)
Potassium: 3.9 mmol/L (ref 3.5–5.1)
Sodium: 139 mmol/L (ref 135–145)
Total Bilirubin: 0.6 mg/dL (ref 0.3–1.2)
Total Protein: 6.4 g/dL — ABNORMAL LOW (ref 6.5–8.1)

## 2020-07-18 LAB — URINALYSIS, ROUTINE W REFLEX MICROSCOPIC
Bilirubin Urine: NEGATIVE
Glucose, UA: NEGATIVE mg/dL
Hgb urine dipstick: NEGATIVE
Ketones, ur: NEGATIVE mg/dL
Leukocytes,Ua: NEGATIVE
Nitrite: NEGATIVE
Protein, ur: NEGATIVE mg/dL
Specific Gravity, Urine: 1.014 (ref 1.005–1.030)
pH: 6 (ref 5.0–8.0)

## 2020-07-18 LAB — CBC WITH DIFFERENTIAL/PLATELET
Abs Immature Granulocytes: 0.03 10*3/uL (ref 0.00–0.07)
Basophils Absolute: 0 10*3/uL (ref 0.0–0.1)
Basophils Relative: 0 %
Eosinophils Absolute: 0 10*3/uL (ref 0.0–0.5)
Eosinophils Relative: 1 %
HCT: 42 % (ref 39.0–52.0)
Hemoglobin: 13.8 g/dL (ref 13.0–17.0)
Immature Granulocytes: 1 %
Lymphocytes Relative: 9 %
Lymphs Abs: 0.4 10*3/uL — ABNORMAL LOW (ref 0.7–4.0)
MCH: 28.9 pg (ref 26.0–34.0)
MCHC: 32.9 g/dL (ref 30.0–36.0)
MCV: 87.9 fL (ref 80.0–100.0)
Monocytes Absolute: 0.8 10*3/uL (ref 0.1–1.0)
Monocytes Relative: 15 %
Neutro Abs: 3.8 10*3/uL (ref 1.7–7.7)
Neutrophils Relative %: 74 %
Platelets: 161 10*3/uL (ref 150–400)
RBC: 4.78 MIL/uL (ref 4.22–5.81)
RDW: 14.5 % (ref 11.5–15.5)
WBC: 5.1 10*3/uL (ref 4.0–10.5)
nRBC: 0 % (ref 0.0–0.2)

## 2020-07-18 LAB — RESP PANEL BY RT-PCR (FLU A&B, COVID) ARPGX2
Influenza A by PCR: NEGATIVE
Influenza B by PCR: NEGATIVE
SARS Coronavirus 2 by RT PCR: POSITIVE — AB

## 2020-07-18 MED ORDER — NIRMATRELVIR/RITONAVIR (PAXLOVID)TABLET: ORAL_TABLET | ORAL | 0 refills | Status: DC

## 2020-07-18 NOTE — ED Notes (Signed)
Date and time results received: 07/18/20 1150   Test: Covid Critical Value: Postitive  Name of Provider Notified: [facility]  Orders Received? Or Actions Taken?: NA

## 2020-07-18 NOTE — ED Triage Notes (Signed)
Pt states having a fever at home. No fever now

## 2020-07-18 NOTE — ED Provider Notes (Signed)
[name] EMERGENCY DEPARTMENT Provider Note   CSN: [id number] Arrival date & time: 07/18/20  1006     History Chief Complaint  Patient presents with  . Fever    Isaac [name] is a 65 y.o. male.  Patient started with fever and aches yesterday.  No vomiting no diarrhea no cough  The history is provided by the patient and medical records. No language interpreter was used.  Fever Temp source:  Oral Severity:  Mild Onset quality:  Sudden Timing:  Constant Progression:  Waxing and waning Chronicity:  New Relieved by:  Nothing Worsened by:  Nothing Associated symptoms: no chest pain, no congestion, no cough, no diarrhea, no headaches and no rash        Past Medical History:  Diagnosis Date  . Arthritis    HX OA LEFT HIP  . GERD (gastroesophageal reflux disease)    ONE SEVERE EPISODE - WENT TO [facility] 03/16/12.   NOT ON ANY MEDS AT PRESENT  . History of blood clots    lt lung  . 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 PE (pulmonary embolism) 2008   PE SEVERAL MONTHS AFTER HIP REPLACEMENT SURGERY    Patient Active Problem List   Diagnosis Date Noted  . S/P left hip revision 12/27/2012    Past Surgical History:  Procedure Laterality Date  . HIP SURGERY  2008   Left TOTAL HIP ARTHROPLASTY  . JOINT REPLACEMENT     LEFT TOTAL HIP ARTHROPLASTY  . TOOTH PULLED UNDER ANESTHESIA    . TOTAL HIP REVISION Left 12/27/2012   Procedure: LEFT TOTAL HIP ARTHROPLASTY REVISION;  Surgeon: Mauri Pole, MD;  Location: [facility];  Service: Orthopedics;  Laterality: Left;       Family History  Problem Relation Age of Onset  . Colon cancer Mother   . Ovarian cancer Mother   . Arthritis Sister   . Diabetes type II Brother     Social History   Tobacco Use  . Smoking status: Former Smoker    Types: Cigars  . Smokeless tobacco: Never Used  Substance Use Topics  . Alcohol use: Yes    Alcohol/week: 0.0 standard  drinks    Comment: occasional  WINE OR BEER ; OCCAS CIGAR  . Drug use: No    Home Medications Prior to Admission medications   Medication Sig Start Date End Date Taking? Authorizing Provider  aspirin-sod bicarb-citric acid (ALKA-SELTZER) 325 MG TBEF tablet Take 325 mg by mouth every 6 (six) hours as needed (heartburn).   Yes [provider]  nirmatrelvir/ritonavir EUA (PAXLOVID) TABS Patient GFR is >60 Take nirmatrelvir (150 mg) 2 tablet(s) twice daily for 5 days and ritonavir (100 mg) one tablet twice daily for 5 days. 07/18/20  Yes Milton Ferguson, MD  hydrocortisone butyrate (LOCOID) 0.1 % CREA cream Apply 1 application topically daily. Use in the evening 05/04/14   Bronson Ing, DPM  Naftifine HCl (NAFTIN) 2 % CREA Apply 1 application topically daily. Use in the morning Patient not taking: Reported on 07/18/2020 05/04/14   Bronson Ing, DPM    Allergies    Other  Review of Systems   Review of Systems  Constitutional: Positive for fever. Negative for appetite change and fatigue.  HENT: Negative for congestion, ear discharge and sinus pressure.   Eyes: Negative for discharge.  Respiratory: Negative for cough.   Cardiovascular: Negative for chest pain.  Gastrointestinal: Negative for abdominal pain and diarrhea.  Genitourinary: Negative for frequency and hematuria.  Musculoskeletal: Negative for back pain.  Skin: Negative for rash.  Neurological: Negative for seizures and headaches.  Psychiatric/Behavioral: Negative for hallucinations.    Physical Exam Updated Vital Signs BP 118/82 (BP Location: Right Arm)   Pulse 83   Temp 98.9 F (37.2 C) (Oral)   Resp 19   SpO2 98%   Physical Exam Vitals and nursing note reviewed.  Constitutional:      Appearance: He is well-developed.  HENT:     Head: Normocephalic.     Nose: Nose normal.  Eyes:     General: No scleral icterus.    Conjunctiva/sclera: Conjunctivae normal.  Neck:     Thyroid: No thyromegaly.   Cardiovascular:     Rate and Rhythm: Normal rate and regular rhythm.     Heart sounds: No murmur heard. No friction rub. No gallop.   Pulmonary:     Breath sounds: No stridor. No wheezing or rales.  Chest:     Chest Walz: No tenderness.  Abdominal:     General: There is no distension.     Tenderness: There is no abdominal tenderness. There is no rebound.  Musculoskeletal:        General: Normal range of motion.     Cervical back: Neck supple.  Lymphadenopathy:     Cervical: No cervical adenopathy.  Skin:    Findings: No erythema or rash.  Neurological:     Mental Status: He is alert and oriented to person, place, and time.     Motor: No abnormal muscle tone.     Coordination: Coordination normal.  Psychiatric:        Behavior: Behavior normal.     ED Results / Procedures / Treatments   Labs (all labs ordered are listed, but only abnormal results are displayed) Labs Reviewed  RESP PANEL BY RT-PCR (FLU A&B, COVID) ARPGX2 - Abnormal; Notable for the following components:      Result Value   SARS Coronavirus 2 by RT PCR POSITIVE (*)    All other components within normal limits  CBC WITH DIFFERENTIAL/PLATELET - Abnormal; Notable for the following components:   Lymphs Abs 0.4 (*)    All other components within normal limits  COMPREHENSIVE METABOLIC PANEL - Abnormal; Notable for the following components:   Glucose, Bld 116 (*)    Calcium 8.7 (*)    Total Protein 6.4 (*)    All other components within normal limits  URINALYSIS, ROUTINE W REFLEX MICROSCOPIC    EKG None  Radiology No results found.  Procedures Procedures   Medications Ordered in ED Medications - No data to display  ED Course  I have reviewed the triage vital signs and the nursing notes.  Pertinent labs & imaging results that were available during my care of the patient were reviewed by me and considered in my medical decision making (see chart for details).    MDM Rules/Calculators/A&P                           Patient is nontoxic positive for Covid.  He will be placed on Paxil of Final Clinical Impression(s) / ED Diagnoses Final diagnoses:  Acute COVID-19    Rx / DC Orders ED Discharge Orders         Ordered    nirmatrelvir/ritonavir EUA (PAXLOVID) TABS        07/18/20 1311  Milton Ferguson, MD 07/20/20 1415

## 2020-07-18 NOTE — Discharge Instructions (Signed)
Go to [facility] drug store today to get the prescription filled

## 2020-07-19 ENCOUNTER — Telehealth: Payer: Self-pay

## 2020-07-19 NOTE — Telephone Encounter (Signed)
Called to discuss with patient about COVID-19 symptoms and the use of one of the available treatments for those with mild to moderate Covid symptoms and at a high risk of hospitalization.  Pt appears to qualify for outpatient treatment due to co-morbid conditions and/or a member of an at-risk group in accordance with the FDA Emergency Use Authorization.    Symptom onset: Unknown Vaccinated: Unknown Booster? 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 Started on Ethete in ED. Unable to reach pt - Left message and call back number [phone].   Marcello Moores

## 2020-10-11 ENCOUNTER — Other Ambulatory Visit (HOSPITAL_COMMUNITY): Payer: Self-pay

## 2021-09-12 DIAGNOSIS — C61 Malignant neoplasm of prostate: Secondary | ICD-10-CM

## 2021-09-12 HISTORY — DX: Malignant neoplasm of prostate: C61

## 2021-11-05 NOTE — Progress Notes (Signed)
GU Location of Tumor / Histology: Prostate Ca  If Prostate Cancer, Gleason Score is (3 + 4) and PSA is (4.36 as of 09/19/2021)  Biopsies      Past/Anticipated interventions by urology, if any:     Past/Anticipated interventions by medical oncology, if any: NA  Weight changes, if any:  No  IPPS:  26 SHIM:  9  Bowel/Bladder complaints, if any:  No  Nausea/Vomiting, if any: No  Pain issues, if any:  0/10  SAFETY ISSUES: Prior radiation? No Pacemaker/ICD? No Possible current pregnancy? Male Is the patient on methotrexate? No  Current Complaints / other details:  Need more information on treatment options.

## 2021-11-13 ENCOUNTER — Encounter: Payer: Self-pay | Admitting: Radiation Oncology

## 2021-11-13 DIAGNOSIS — C61 Malignant neoplasm of prostate: Secondary | ICD-10-CM

## 2021-11-13 NOTE — Progress Notes (Signed)
Radiation Oncology         [phone]) [phone] ________________________________  Initial Outpatient Consultation  Name: Isaac [name] MRN: [id number]  Date: 11/14/2021  DOB: [birth year]  UY:QIHK, [name], MD  [name], MD   REFERRING PHYSICIAN: [name], MD  DIAGNOSIS: 66 y.o. gentleman with Stage T1c adenocarcinoma of the prostate with Gleason score of 3+4, and PSA of 4.36.    ICD-10-CM   1. Malignant neoplasm of prostate ([place])  C61       HISTORY OF PRESENT ILLNESS: Isaac [name] is a 66 y.o. male with a diagnosis of prostate cancer. He was noted to have an elevated PSA of 5.15 by his primary care physician, Dr. [name].  Accordingly, he was referred for evaluation in urology by Dr. [name] on 09/19/21,  digital rectal examination was performed at that time revealing no nodularity.  The patient proceeded to transrectal ultrasound with 12 biopsies of the prostate on 10/02/21.  The prostate volume measured 23.5 cc.  Out of 12 core biopsies, 5 were positive.  The maximum Gleason score was 3+4, and this was seen in the left base. Additionally, Gleason 3+3 was seen in the left mid lateral, left apex lateral, right mid lateral and right apex lateral.  The patient reviewed the biopsy results with his urologist and he has kindly been referred today for discussion of potential radiation treatment options.   PREVIOUS RADIATION THERAPY: No  PAST MEDICAL HISTORY:  Past Medical History:  Diagnosis Date   Arthritis    HX OA LEFT HIP   GERD (gastroesophageal reflux disease)    ONE SEVERE EPISODE - WENT TO [place] 03/16/12.   NOT ON ANY MEDS AT PRESENT   History of blood clots    lt lung   Malignant neoplasm of prostate ([place]) 11/13/2021   Pain    PAIN LEFT HIP, "LOCKS UP SOMETIMES"  -HAS FAILED LEFT TOTAL HIP ARTHROPLASTY - METALOSIS   PE (pulmonary embolism) 2008   PE SEVERAL MONTHS AFTER HIP REPLACEMENT SURGERY      PAST SURGICAL HISTORY: Past Surgical History:  Procedure Laterality  Date   HIP SURGERY  2008   Left TOTAL HIP ARTHROPLASTY   JOINT REPLACEMENT     LEFT TOTAL HIP ARTHROPLASTY   TOOTH PULLED UNDER ANESTHESIA     TOTAL HIP REVISION Left 12/27/2012   Procedure: LEFT TOTAL HIP ARTHROPLASTY REVISION;  Surgeon: Mauri Pole, MD;  Location: [facility];  Service: Orthopedics;  Laterality: Left;    FAMILY HISTORY:  Family History  Problem Relation Age of Onset   Colon cancer Mother    Ovarian cancer Mother    Arthritis Sister    Diabetes type II Brother     SOCIAL HISTORY:  Social History   Socioeconomic History   Marital status: Single    Spouse name: Not on file   Number of children: Not on file   Years of education: Not on file   Highest education level: Not on file  Occupational History   Not on file  Tobacco Use   Smoking status: Former    Types: Cigars   Smokeless tobacco: Never  Substance and Sexual Activity   Alcohol use: Yes    Alcohol/week: 0.0 standard drinks of alcohol    Comment: occasional  WINE OR BEER ; OCCAS CIGAR   Drug use: No   Sexual activity: Not on file  Other Topics Concern   Not on file  Social History Narrative   Not  on file   Social Determinants of Health   Financial Resource Strain: Not on file  Food Insecurity: Not on file  Transportation Needs: Not on file  Physical Activity: Not on file  Stress: Not on file  Social Connections: Not on file  Intimate Partner Violence: Not on file    ALLERGIES: Other  MEDICATIONS:  Current Outpatient Medications  Medication Sig Dispense Refill   aspirin-sod bicarb-citric acid (ALKA-SELTZER) 325 MG TBEF tablet Take 325 mg by mouth every 6 (six) hours as needed (heartburn).     hydrocortisone butyrate (LOCOID) 0.1 % CREA cream Apply 1 application topically daily. Use in the evening 45 g 2   Naftifine HCl (NAFTIN) 2 % CREA Apply 1 application topically daily. Use in the morning (Patient not taking: Reported on 07/18/2020) 60 g 2   nirmatrelvir/ritonavir EUA (PAXLOVID) TABS  Patient GFR is >60 Take nirmatrelvir (150 mg) 2 tablet(s) twice daily for 5 days and ritonavir (100 mg) one tablet twice daily for 5 days. 15 tablet 0   No current facility-administered medications for this visit.   REVIEW OF SYSTEMS:  On review of systems, the patient reports that he is doing well overall. He denies any chest pain, shortness of breath, cough, fevers, chills, night sweats, unintended weight changes. He denies any bowel disturbances, and denies abdominal pain, nausea or vomiting. He denies any new musculoskeletal or joint aches or pains. His IPSS was 8, indicating mild urinary symptoms with occasional hesitancy and weak stream early in the mornings and occasional nocturia 2-3x/night depending on fluid intake but otherwise, no bothersome symptoms and he feels that he empties his bladder well on voiding.  His SHIM was 9, indicating he has moderate erectile dysfunction. A complete review of systems is obtained and is otherwise negative.   PHYSICAL EXAM:  Wt Readings from Last 3 Encounters:  12/27/12 173 lb 9.6 oz (78.7 kg)  12/22/12 173 lb 9.6 oz (78.7 kg)  03/16/12 180 lb (81.6 kg)   Temp Readings from Last 3 Encounters:  07/18/20 98.9 F (37.2 C) (Oral)  12/28/12 98.7 F (37.1 C) (Oral)  12/22/12 97.5 F (36.4 C) (Oral)   BP Readings from Last 3 Encounters:  07/18/20 118/82  05/04/14 130/90  12/28/12 119/71   Pulse Readings from Last 3 Encounters:  07/18/20 83  05/04/14 80  12/28/12 74    /10  In general this is a well appearing African American male in no acute distress. He's alert and oriented x4 and appropriate throughout the examination. Cardiopulmonary assessment is negative for acute distress, and he exhibits normal effort.     KPS = 100  100 - Normal; no complaints; no evidence of disease. 90   - Able to carry on normal activity; minor signs or symptoms of disease. 80   - Normal activity with effort; some signs or symptoms of disease. 54   - Cares for  self; unable to carry on normal activity or to do active work. 60   - Requires occasional assistance, but is able to care for most of his personal needs. 50   - Requires considerable assistance and frequent medical care. 39   - Disabled; requires special care and assistance. 94   - Severely disabled; hospital admission is indicated although death not imminent. 30   - Very sick; hospital admission necessary; active supportive treatment necessary. 10   - Moribund; fatal processes progressing rapidly. 0     - Dead  Karnofsky DA, Abelmann [place], Craver LS and Burchenal [facility] (  1948) The use of the nitrogen mustards in the palliative treatment of carcinoma: with particular reference to bronchogenic carcinoma Cancer 1 634-56  LABORATORY DATA:  Lab Results  Component Value Date   WBC 5.1 07/18/2020   HGB 13.8 07/18/2020   HCT 42.0 07/18/2020   MCV 87.9 07/18/2020   PLT 161 07/18/2020   Lab Results  Component Value Date   NA 139 07/18/2020   K 3.9 07/18/2020   CL 105 07/18/2020   CO2 26 07/18/2020   Lab Results  Component Value Date   ALT 26 07/18/2020   AST 21 07/18/2020   ALKPHOS 41 07/18/2020   BILITOT 0.6 07/18/2020     RADIOGRAPHY: No results found.    IMPRESSION/PLAN: 1. 66 y.o. gentleman with Stage T1c adenocarcinoma of the prostate with Gleason Score of 3+4, and PSA of 4.36. We discussed the patient's workup and outlined the nature of prostate cancer in this setting. The patient's T stage, Gleason's score, and PSA put him into the favorable intermdiate risk group. Accordingly, he is eligible for a variety of potential treatment options including brachytherapy, 5.5-8 weeks of external radiation, or prostatectomy. We discussed the available radiation techniques, and focused on the details and logistics of delivery. We discussed and outlined the risks, benefits, short and long-term effects associated with radiotherapy and compared and contrasted these with prostatectomy. We discussed the  role of SpaceOAR gel in reducing the rectal toxicity associated with radiotherapy. He appears to have a good understanding of his disease and our treatment recommendations which are of curative intent.  He was encouraged to ask questions that were answered to his stated satisfaction.  At the conclusion of our conversation, the patient is interested in moving forward with brachytherapy and use of SpaceOAR gel to reduce rectal toxicity from radiotherapy.  We will share our discussion with Dr. [name] and move forward with scheduling his [facility] planning appointment in the near future.  The patient met briefly with Romie Jumper in our office who will be working closely with him to coordinate OR scheduling and pre and post procedure appointments.  We will contact the pharmaceutical rep to ensure that [employer] is available at the time of procedure.  We enjoyed meeting him today and look forward to continuing to participate in his care. .  We personally spent 60 minutes in this encounter including chart review, reviewing radiological studies, meeting face-to-face with the patient, entering orders and completing documentation.    Nicholos Johns, PA-C    Tyler Pita, MD  [facility] Direct Dial: [phone]  Fax: [phone] [email or web address]  Skype  LinkedIn

## 2021-11-14 ENCOUNTER — Ambulatory Visit
Admission: RE | Admit: 2021-11-14 | Discharge: 2021-11-14 | Disposition: A | Discharge status: Home / Self Care | Payer: 59 | Source: Ambulatory Visit | Attending: Radiation Oncology | Admitting: Radiation Oncology

## 2021-11-14 ENCOUNTER — Other Ambulatory Visit: Payer: Self-pay

## 2021-11-14 DIAGNOSIS — Z800 Family history of malignant neoplasm of digestive organs: Secondary | ICD-10-CM | POA: Diagnosis not present

## 2021-11-14 DIAGNOSIS — Z8041 Family history of malignant neoplasm of ovary: Secondary | ICD-10-CM | POA: Insufficient documentation

## 2021-11-14 DIAGNOSIS — C61 Malignant neoplasm of prostate: Secondary | ICD-10-CM | POA: Diagnosis present

## 2021-11-14 DIAGNOSIS — K219 Gastro-esophageal reflux disease without esophagitis: Secondary | ICD-10-CM | POA: Diagnosis not present

## 2021-11-14 DIAGNOSIS — Z87891 Personal history of nicotine dependence: Secondary | ICD-10-CM | POA: Insufficient documentation

## 2021-11-14 DIAGNOSIS — Z86711 Personal history of pulmonary embolism: Secondary | ICD-10-CM | POA: Insufficient documentation

## 2021-11-14 NOTE — Progress Notes (Signed)
Introduced myself to the patient as the prostate nurse navigator.  No barriers to care identified at this time.  He is here to discuss his radiation treatment options.  I gave him my business card and asked him to call me with questions or concerns.  Verbalized understanding.  ?

## 2021-11-16 NOTE — Addendum Note (Signed)
Encounter addended by: Tyler Pita, MD on: 11/16/2021 12:35 PM  Actions taken: Problem List reviewed, Medication List reviewed, Allergies reviewed

## 2021-11-19 ENCOUNTER — Telehealth: Payer: Self-pay | Admitting: *Deleted

## 2021-11-19 NOTE — Progress Notes (Signed)
Pt does not need ekg prior to surgery on 02-26-2022 '@[facility]'$  by Dr [name].  Called and left message w/ Enid Derry , office staff for Dr Tammi Klippel.

## 2021-11-19 NOTE — Telephone Encounter (Signed)
Called patient to inform of pre-seed appts. for 01-09-22 and his implant for 02-26-22, spoke with patient and he is aware of these appts.

## 2021-11-21 ENCOUNTER — Other Ambulatory Visit: Payer: Self-pay | Admitting: Urology

## 2022-01-08 ENCOUNTER — Telehealth: Payer: Self-pay | Admitting: *Deleted

## 2022-01-08 NOTE — Telephone Encounter (Signed)
CALLED PATIENT TO REMIND OF PRE-SEED APPTS. FOR 01-09-22, SPOKE WITH PATIENT AND HE IS AWARE OF THESE APPTS.

## 2022-01-09 ENCOUNTER — Ambulatory Visit
Admission: RE | Admit: 2022-01-09 | Discharge: 2022-01-09 | Disposition: A | Discharge status: Home / Self Care | Payer: 59 | Source: Ambulatory Visit | Attending: Radiation Oncology | Admitting: Radiation Oncology

## 2022-01-09 ENCOUNTER — Ambulatory Visit
Admission: RE | Admit: 2022-01-09 | Discharge: 2022-01-09 | Disposition: A | Discharge status: Home / Self Care | Payer: 59 | Source: Ambulatory Visit | Attending: Urology | Admitting: Urology

## 2022-01-09 ENCOUNTER — Other Ambulatory Visit: Payer: Self-pay

## 2022-01-09 ENCOUNTER — Encounter: Payer: Self-pay | Admitting: Urology

## 2022-01-09 VITALS — Resp 18 | Ht 67.0 in | Wt 178.0 lb

## 2022-01-09 DIAGNOSIS — C61 Malignant neoplasm of prostate: Secondary | ICD-10-CM

## 2022-01-09 NOTE — Progress Notes (Signed)
  Radiation Oncology         [phone] ________________________________  Name: Isaac [name] MRN: [id number]  Date: 01/09/2022  DOB: January 09, 1956  SIMULATION AND TREATMENT PLANNING NOTE PUBIC ARCH STUDY  KG:SUPJ, [name], MD  [name], MD  DIAGNOSIS: 66 y.o. gentleman with Stage T1c adenocarcinoma of the prostate with Gleason score of 3+4, and PSA of 4.36  Oncology History  Malignant neoplasm of prostate ([place])  10/02/2021 Cancer Staging   Staging form: Prostate, AJCC 8th Edition - Clinical stage from 10/02/2021: Stage IIB (cT1c, cN0, cM0, PSA: 4.4, Grade Group: 2) - Signed by Freeman Caldron, PA-C on 11/13/2021 Histopathologic type: Adenocarcinoma, NOS Prostate specific antigen (PSA) range: Less than 10 Gleason primary pattern: 3 Gleason secondary pattern: 4 Gleason score: 7 Histologic grading system: 5 grade system Number of biopsy cores examined: 12 Number of biopsy cores positive: 5 Location of positive needle core biopsies: Both sides   11/13/2021 Initial Diagnosis   Malignant neoplasm of prostate ([place])       ICD-10-CM   1. Malignant neoplasm of prostate ([place])  C61       COMPLEX SIMULATION:  The patient presented today for evaluation for possible prostate seed implant. He was brought to the radiation planning suite and placed supine on the CT couch. A 3-dimensional image study set was obtained in upload to the planning computer. There, on each axial slice, I contoured the prostate gland. Then, using three-dimensional radiation planning tools I reconstructed the prostate in view of the structures from the transperineal needle pathway to assess for possible pubic arch interference. In doing so, I did not appreciate any pubic arch interference. Also, the patient's prostate volume was estimated based on the drawn structure. The volume was 23 cc.  Given the pubic arch appearance and prostate volume, patient remains a good candidate to proceed with prostate seed implant.  Today, he freely provided informed written consent to proceed.    PLAN: The patient will undergo prostate seed implant.   ________________________________  Sheral Apley. Tammi Klippel, M.D.

## 2022-01-09 NOTE — Progress Notes (Signed)
Radiation Oncology         [phone]) [phone] ________________________________  Outpatient Follow up- Pre-seed visit  Name: Isaac [name] MRN: [id number]  Date: 01/09/2022  DOB: 06-07-1955  VQ:[id number], Isaac Sages, MD  [name], MD   REFERRING PHYSICIAN: [name], MD  DIAGNOSIS: 66 y.o. gentleman with Stage T1c adenocarcinoma of the prostate with Gleason score of 3+4, and PSA of 4.36.    ICD-10-CM   1. Malignant neoplasm of prostate ([place])  C61       HISTORY OF PRESENT ILLNESS: BENIGNO [name] is a 66 y.o. male with a diagnosis of prostate cancer. He was noted to have an elevated PSA of 5.15 by his primary care physician, Dr. [name].  Accordingly, he was referred for evaluation in urology by Dr. [name] on 09/19/21,  digital rectal examination was performed at that time revealing no nodularity.  The patient proceeded to transrectal ultrasound with 12 biopsies of the prostate on 10/02/21.  The prostate volume measured 23.5 cc.  Out of 12 core biopsies, 5 were positive.  The maximum Gleason score was 3+4, and this was seen in the left base. Additionally, Gleason 3+3 was seen in the left mid lateral, left apex lateral, right mid lateral and right apex lateral.  The patient reviewed the biopsy results with his urologist and was kindly referred to [place] for discussion of potential radiation treatment options. We initially met the patient on 11/14/21 and he was most interested in proceeding with brachytherapy and SpaceOAR gel placement for treatment of his disease. He is here today for his pre-procedure imaging for planning and to answer any additional questions he may have about this treatment.   PREVIOUS RADIATION THERAPY: No  PAST MEDICAL HISTORY:  Past Medical History:  Diagnosis Date   Arthritis    HX OA LEFT HIP   GERD (gastroesophageal reflux disease)    ONE SEVERE EPISODE - WENT TO [place] 03/16/12.   NOT ON ANY MEDS AT PRESENT   History of blood clots    lt lung   Malignant neoplasm of  prostate ([place]) 11/13/2021   Pain    PAIN LEFT HIP, "LOCKS UP SOMETIMES"  -HAS FAILED LEFT TOTAL HIP ARTHROPLASTY - METALOSIS   PE (pulmonary embolism) 2008   PE SEVERAL MONTHS AFTER HIP REPLACEMENT SURGERY      PAST SURGICAL HISTORY: Past Surgical History:  Procedure Laterality Date   HIP SURGERY  2008   Left TOTAL HIP ARTHROPLASTY   JOINT REPLACEMENT     LEFT TOTAL HIP ARTHROPLASTY   TOOTH PULLED UNDER ANESTHESIA     TOTAL HIP REVISION Left 12/27/2012   Procedure: LEFT TOTAL HIP ARTHROPLASTY REVISION;  Surgeon: Mauri Pole, MD;  Location: [facility];  Service: Orthopedics;  Laterality: Left;    FAMILY HISTORY:  Family History  Problem Relation Age of Onset   Colon cancer Mother    Ovarian cancer Mother    Arthritis Sister    Diabetes type II Brother     SOCIAL HISTORY:  Social History   Socioeconomic History   Marital status: Single    Spouse name: Not on file   Number of children: Not on file   Years of education: Not on file   Highest education level: Not on file  Occupational History   Not on file  Tobacco Use   Smoking status: Former    Types: Cigars   Smokeless tobacco: Never  Substance and Sexual Activity   Alcohol use: Yes  Alcohol/week: 0.0 standard drinks of alcohol    Comment: occasional  WINE OR BEER ; OCCAS CIGAR   Drug use: No   Sexual activity: Not on file  Other Topics Concern   Not on file  Social History Narrative   Not on file   Social Determinants of Health   Financial Resource Strain: Not on file  Food Insecurity: Not on file  Transportation Needs: Not on file  Physical Activity: Not on file  Stress: Not on file  Social Connections: Not on file  Intimate Partner Violence: Not on file    ALLERGIES: Neomycin and Other  MEDICATIONS:  Current Outpatient Medications  Medication Sig Dispense Refill   aspirin-sod bicarb-citric acid (ALKA-SELTZER) 325 MG TBEF tablet Take 325 mg by mouth every 6 (six) hours as needed (heartburn).      hydrochlorothiazide (HYDRODIURIL) 25 MG tablet Take 25 mg by mouth daily.     sildenafil (VIAGRA) 100 MG tablet Take 100 mg by mouth daily.     No current facility-administered medications for this encounter.    REVIEW OF SYSTEMS:  On review of systems, the patient reports that he is doing well overall. He denies any chest pain, shortness of breath, cough, fevers, chills, night sweats, unintended weight changes. He denies any bowel disturbances, and denies abdominal pain, nausea or vomiting. He denies any new musculoskeletal or joint aches or pains. His IPSS was 8, indicating mild urinary symptoms with occasional hesitancy and weak stream early in the mornings and occasional nocturia 2-3x/night depending on fluid intake but otherwise, no bothersome symptoms and he feels that he empties his bladder well on voiding.  His SHIM was 9, indicating he has moderate erectile dysfunction. A complete review of systems is obtained and is otherwise negative.    PHYSICAL EXAM:  Wt Readings from Last 3 Encounters:  01/09/22 178 lb (80.7 kg)  11/14/21 173 lb 2 oz (78.5 kg)  12/27/12 173 lb 9.6 oz (78.7 kg)   Temp Readings from Last 3 Encounters:  11/14/21 97.7 F (36.5 C) (Oral)  07/18/20 98.9 F (37.2 C) (Oral)  12/28/12 98.7 F (37.1 C) (Oral)   BP Readings from Last 3 Encounters:  11/14/21 (!) 136/93  07/18/20 118/82  05/04/14 130/90   Pulse Readings from Last 3 Encounters:  11/14/21 77  07/18/20 83  05/04/14 80   Pain Assessment Pain Score: 0-No pain/10  In general this is a well appearing African American male in no acute distress. He's alert and oriented x4 and appropriate throughout the examination. Cardiopulmonary assessment is negative for acute distress, and he exhibits normal effort.     KPS = 100  100 - Normal; no complaints; no evidence of disease. 90   - Able to carry on normal activity; minor signs or symptoms of disease. 80   - Normal activity with effort; some signs or  symptoms of disease. 78   - Cares for self; unable to carry on normal activity or to do active work. 60   - Requires occasional assistance, but is able to care for most of his personal needs. 50   - Requires considerable assistance and frequent medical care. 85   - Disabled; requires special care and assistance. 72   - Severely disabled; hospital admission is indicated although death not imminent. 65   - Very sick; hospital admission necessary; active supportive treatment necessary. 10   - Moribund; fatal processes progressing rapidly. 0     - Dead  Karnofsky DA, Abelmann WH, Craver LS  and [facility] (1948) The use of the nitrogen mustards in the palliative treatment of carcinoma: with particular reference to bronchogenic carcinoma Cancer 1 634-56  LABORATORY DATA:  Lab Results  Component Value Date   WBC 5.1 07/18/2020   HGB 13.8 07/18/2020   HCT 42.0 07/18/2020   MCV 87.9 07/18/2020   PLT 161 07/18/2020   Lab Results  Component Value Date   NA 139 07/18/2020   K 3.9 07/18/2020   CL 105 07/18/2020   CO2 26 07/18/2020   Lab Results  Component Value Date   ALT 26 07/18/2020   AST 21 07/18/2020   ALKPHOS 41 07/18/2020   BILITOT 0.6 07/18/2020     RADIOGRAPHY: No results found.    IMPRESSION/PLAN: 1. 66 y.o. gentleman with Stage T1c adenocarcinoma of the prostate with Gleason score of 3+4, and PSA of 4.36. The patient has elected to proceed with seed implant for treatment of his disease. We reviewed the risks, benefits, short and long-term effects associated with brachytherapy and discussed the role of SpaceOAR in reducing the rectal toxicity associated with radiotherapy.  He appears to have a good understanding of his disease and our treatment recommendations which are of curative intent.  He was encouraged to ask questions that were answered to his stated satisfaction. He has freely signed written consent to proceed today in the office and a copy of this document will be  placed in his medical record. His procedure is tentatively scheduled for 02/26/22 in collaboration with Dr. [name] and we will see him back for his post-procedure visit approximately 3 weeks thereafter. We look forward to continuing to participate in his care. He knows that he is welcome to call with any questions or concerns at any time in the interim.  I personally spent 20 minutes in this encounter including chart review, reviewing radiological studies, meeting face-to-face with the patient, entering orders and completing documentation.    Nicholos Johns, MMS, PA-C [place]: [phone]  Fax: [phone]

## 2022-01-09 NOTE — Progress Notes (Addendum)
Pre-seed appointment. I verified patient's identity and began nursing interview. Patient is going well. No prostate discomfort reported at this time.  Meaningful use complete. No urinary management medications. Urology appt- Nov, 2023  Resp 18   Ht '5\' 7"'$  (1.702 m)   Wt 178 lb (80.7 kg)   BMI 27.88 kg/m

## 2022-02-21 ENCOUNTER — Encounter (HOSPITAL_BASED_OUTPATIENT_CLINIC_OR_DEPARTMENT_OTHER): Payer: Self-pay | Admitting: Urology

## 2022-02-24 ENCOUNTER — Encounter (HOSPITAL_BASED_OUTPATIENT_CLINIC_OR_DEPARTMENT_OTHER): Payer: Self-pay | Admitting: Urology

## 2022-02-24 NOTE — Progress Notes (Signed)
Spoke w/ via phone for pre-op interview--- pt Lab needs dos----  [employer], ekg             Lab results------ no COVID test -----patient states asymptomatic no test needed Arrive at ------- 1200 on 02-26-2022 NPO after MN NO Solid Food.  Clear liquids from MN until--- 1100 Med rec completed Medications to take morning of surgery ----- none Diabetic medication ----- n/a Patient instructed no nail polish to be worn day of surgery Patient instructed to bring photo id and insurance card day of surgery Patient aware to have Driver (ride ) / caregiver for 24 hours after surgery --- friend, jimmy  Patient Special Instructions ----- will do one fleet enema morning of surgery Pre-Op special Istructions ----- n/a Patient verbalized understanding of instructions that were given at this phone interview. Patient denies shortness of breath, chest pain, fever, cough at this phone interview.

## 2022-02-25 ENCOUNTER — Telehealth: Payer: Self-pay | Admitting: *Deleted

## 2022-02-25 NOTE — Telephone Encounter (Signed)
CALLED  PATIENT TO REMIND OF PROCEDURE FOR 02-26-22, SPOKE WITH PATIENT AND HE IS AWARE OF THIS PROCEDURE

## 2022-02-26 ENCOUNTER — Ambulatory Visit (HOSPITAL_COMMUNITY): Payer: 59

## 2022-02-26 ENCOUNTER — Encounter (HOSPITAL_BASED_OUTPATIENT_CLINIC_OR_DEPARTMENT_OTHER): Payer: Self-pay | Admitting: Urology

## 2022-02-26 ENCOUNTER — Ambulatory Visit (HOSPITAL_BASED_OUTPATIENT_CLINIC_OR_DEPARTMENT_OTHER)
Admission: RE | Admit: 2022-02-26 | Discharge: 2022-02-26 | Disposition: A | Discharge status: Home / Self Care | Payer: 59 | Source: Ambulatory Visit | Attending: Urology | Admitting: Urology

## 2022-02-26 ENCOUNTER — Ambulatory Visit (HOSPITAL_BASED_OUTPATIENT_CLINIC_OR_DEPARTMENT_OTHER): Payer: 59 | Admitting: Anesthesiology

## 2022-02-26 ENCOUNTER — Encounter (HOSPITAL_BASED_OUTPATIENT_CLINIC_OR_DEPARTMENT_OTHER)
Admission: RE | Disposition: A | Discharge status: Home / Self Care | Payer: Self-pay | Source: Ambulatory Visit | Attending: Urology

## 2022-02-26 ENCOUNTER — Other Ambulatory Visit: Payer: Self-pay

## 2022-02-26 DIAGNOSIS — N5201 Erectile dysfunction due to arterial insufficiency: Secondary | ICD-10-CM | POA: Insufficient documentation

## 2022-02-26 DIAGNOSIS — Z87891 Personal history of nicotine dependence: Secondary | ICD-10-CM | POA: Diagnosis not present

## 2022-02-26 DIAGNOSIS — I10 Essential (primary) hypertension: Secondary | ICD-10-CM | POA: Diagnosis not present

## 2022-02-26 DIAGNOSIS — K219 Gastro-esophageal reflux disease without esophagitis: Secondary | ICD-10-CM | POA: Insufficient documentation

## 2022-02-26 DIAGNOSIS — C61 Malignant neoplasm of prostate: Secondary | ICD-10-CM | POA: Diagnosis present

## 2022-02-26 DIAGNOSIS — Z79899 Other long term (current) drug therapy: Secondary | ICD-10-CM | POA: Insufficient documentation

## 2022-02-26 DIAGNOSIS — Z01818 Encounter for other preprocedural examination: Secondary | ICD-10-CM

## 2022-02-26 HISTORY — DX: Benign prostatic hyperplasia with lower urinary tract symptoms: N40.1

## 2022-02-26 HISTORY — PX: CYSTOSCOPY: SHX5120

## 2022-02-26 HISTORY — DX: Male erectile dysfunction, unspecified: N52.9

## 2022-02-26 HISTORY — DX: Benign prostatic hyperplasia without lower urinary tract symptoms: N40.0

## 2022-02-26 HISTORY — PX: RADIOACTIVE SEED IMPLANT: SHX5150

## 2022-02-26 HISTORY — PX: SPACE OAR INSTILLATION: SHX6769

## 2022-02-26 HISTORY — DX: Unspecified osteoarthritis, unspecified site: M19.90

## 2022-02-26 HISTORY — DX: Essential (primary) hypertension: I10

## 2022-02-26 LAB — POCT I-STAT, CHEM 8
BUN: 11 mg/dL (ref 8–23)
Calcium, Ion: 1.29 mmol/L (ref 1.15–1.40)
Chloride: 102 mmol/L (ref 98–111)
Creatinine, Ser: 1 mg/dL (ref 0.61–1.24)
Glucose, Bld: 95 mg/dL (ref 70–99)
HCT: 48 % (ref 39.0–52.0)
Hemoglobin: 16.3 g/dL (ref 13.0–17.0)
Potassium: 3.7 mmol/L (ref 3.5–5.1)
Sodium: 142 mmol/L (ref 135–145)
TCO2: 28 mmol/L (ref 22–32)

## 2022-02-26 SURGERY — INSERTION, RADIATION SOURCE, PROSTATE
Anesthesia: General | Site: Prostate

## 2022-02-26 MED ORDER — DEXAMETHASONE SODIUM PHOSPHATE 10 MG/ML IJ SOLN
INTRAMUSCULAR | Status: AC
  Filled 2022-02-26: qty 1

## 2022-02-26 MED ORDER — SODIUM CHLORIDE 0.9 % IR SOLN
Status: DC | PRN
  Administered 2022-02-26: 300 mL via INTRAVESICAL

## 2022-02-26 MED ORDER — ONDANSETRON HCL 4 MG/2ML IJ SOLN
INTRAMUSCULAR | Status: DC | PRN
  Administered 2022-02-26: 4 mg via INTRAVENOUS

## 2022-02-26 MED ORDER — MIDAZOLAM HCL 2 MG/2ML IJ SOLN
INTRAMUSCULAR | Status: DC | PRN
  Administered 2022-02-26: 2 mg via INTRAVENOUS

## 2022-02-26 MED ORDER — ACETAMINOPHEN 500 MG PO TABS
1000.0000 mg | ORAL_TABLET | Freq: Once | ORAL | Status: AC
  Administered 2022-02-26: 1000 mg via ORAL

## 2022-02-26 MED ORDER — LIDOCAINE HCL (PF) 2 % IJ SOLN
INTRAMUSCULAR | Status: AC
  Filled 2022-02-26: qty 5

## 2022-02-26 MED ORDER — PHENYLEPHRINE 80 MCG/ML (10ML) SYRINGE FOR IV PUSH (FOR BLOOD PRESSURE SUPPORT)
PREFILLED_SYRINGE | INTRAVENOUS | Status: AC
  Filled 2022-02-26: qty 10

## 2022-02-26 MED ORDER — PROPOFOL 10 MG/ML IV BOLUS
INTRAVENOUS | Status: DC | PRN
  Administered 2022-02-26: 160 mg via INTRAVENOUS

## 2022-02-26 MED ORDER — CIPROFLOXACIN IN D5W 400 MG/200ML IV SOLN
400.0000 mg | INTRAVENOUS | Status: AC
  Administered 2022-02-26: 400 mg via INTRAVENOUS

## 2022-02-26 MED ORDER — OXYCODONE HCL 5 MG PO TABS: 5.0000 mg | ORAL_TABLET | ORAL | 0 refills | Status: DC | PRN

## 2022-02-26 MED ORDER — CIPROFLOXACIN IN D5W 400 MG/200ML IV SOLN
INTRAVENOUS | Status: AC
  Filled 2022-02-26: qty 200

## 2022-02-26 MED ORDER — LACTATED RINGERS IV SOLN: INTRAVENOUS | Status: DC

## 2022-02-26 MED ORDER — ONDANSETRON HCL 4 MG/2ML IJ SOLN
INTRAMUSCULAR | Status: AC
  Filled 2022-02-26: qty 2

## 2022-02-26 MED ORDER — PHENYLEPHRINE 80 MCG/ML (10ML) SYRINGE FOR IV PUSH (FOR BLOOD PRESSURE SUPPORT)
PREFILLED_SYRINGE | INTRAVENOUS | Status: DC | PRN
  Administered 2022-02-26: 80 ug via INTRAVENOUS

## 2022-02-26 MED ORDER — IOHEXOL 300 MG/ML  SOLN
INTRAMUSCULAR | Status: DC | PRN
  Administered 2022-02-26: 7 mL

## 2022-02-26 MED ORDER — MIDAZOLAM HCL 2 MG/2ML IJ SOLN
INTRAMUSCULAR | Status: AC
  Filled 2022-02-26: qty 2

## 2022-02-26 MED ORDER — FLEET ENEMA 7-19 GM/118ML RE ENEM: 1.0000 | ENEMA | Freq: Once | RECTAL | Status: DC

## 2022-02-26 MED ORDER — FENTANYL CITRATE (PF) 100 MCG/2ML IJ SOLN
INTRAMUSCULAR | Status: DC | PRN
  Administered 2022-02-26: 25 ug via INTRAVENOUS
  Administered 2022-02-26: 50 ug via INTRAVENOUS
  Administered 2022-02-26: 25 ug via INTRAVENOUS

## 2022-02-26 MED ORDER — STERILE WATER FOR IRRIGATION IR SOLN
Status: DC | PRN
  Administered 2022-02-26: 3 mL

## 2022-02-26 MED ORDER — FENTANYL CITRATE (PF) 100 MCG/2ML IJ SOLN
INTRAMUSCULAR | Status: AC
  Filled 2022-02-26: qty 2

## 2022-02-26 MED ORDER — ACETAMINOPHEN 500 MG PO TABS
ORAL_TABLET | ORAL | Status: AC
  Filled 2022-02-26: qty 2

## 2022-02-26 MED ORDER — SUGAMMADEX SODIUM 200 MG/2ML IV SOLN
INTRAVENOUS | Status: DC | PRN
  Administered 2022-02-26: 200 mg via INTRAVENOUS

## 2022-02-26 MED ORDER — DEXAMETHASONE SODIUM PHOSPHATE 10 MG/ML IJ SOLN
INTRAMUSCULAR | Status: DC | PRN
  Administered 2022-02-26: 5 mg via INTRAVENOUS

## 2022-02-26 MED ORDER — SODIUM CHLORIDE (PF) 0.9 % IJ SOLN
INTRAMUSCULAR | Status: DC | PRN
  Administered 2022-02-26: 10 mL

## 2022-02-26 MED ORDER — ROCURONIUM BROMIDE 10 MG/ML (PF) SYRINGE
PREFILLED_SYRINGE | INTRAVENOUS | Status: AC
  Filled 2022-02-26: qty 10

## 2022-02-26 MED ORDER — ROCURONIUM BROMIDE 10 MG/ML (PF) SYRINGE
PREFILLED_SYRINGE | INTRAVENOUS | Status: DC | PRN
  Administered 2022-02-26: 70 mg via INTRAVENOUS

## 2022-02-26 MED ORDER — LIDOCAINE 2% (20 MG/ML) 5 ML SYRINGE
INTRAMUSCULAR | Status: DC | PRN
  Administered 2022-02-26: 100 mg via INTRAVENOUS

## 2022-02-26 SURGICAL SUPPLY — 46 items
BAG DRN RND TRDRP ANRFLXCHMBR (UROLOGICAL SUPPLIES) ×2
BAG URINE DRAIN 2000ML AR STRL (UROLOGICAL SUPPLIES) ×2 IMPLANT
BLADE CLIPPER SENSICLIP SURGIC (BLADE) ×2 IMPLANT
Bard QuickLink Cartridges with BrachySource I-125 IMPLANT
CATH FOLEY 2WAY SLVR  5CC 16FR (CATHETERS) ×2
CATH FOLEY 2WAY SLVR 5CC 16FR (CATHETERS) ×2 IMPLANT
CATH ROBINSON RED A/P 16FR (CATHETERS) IMPLANT
CATH ROBINSON RED A/P 20FR (CATHETERS) ×2 IMPLANT
CLOTH BEACON ORANGE TIMEOUT ST (SAFETY) ×2 IMPLANT
CNTNR URN SCR LID CUP LEK RST (MISCELLANEOUS) ×2 IMPLANT
CONT SPEC 4OZ STRL OR WHT (MISCELLANEOUS)
COVER BACK TABLE 60X90IN (DRAPES) ×2 IMPLANT
COVER MAYO STAND STRL (DRAPES) ×2 IMPLANT
DRSG TEGADERM 4X4.75 (GAUZE/BANDAGES/DRESSINGS) ×2 IMPLANT
DRSG TEGADERM 8X12 (GAUZE/BANDAGES/DRESSINGS) ×2 IMPLANT
GAUZE SPONGE 4X4 12PLY STRL (GAUZE/BANDAGES/DRESSINGS) IMPLANT
GEL ULTRASOUND 20GR AQUASONIC (MISCELLANEOUS) ×4 IMPLANT
GLOVE BIO SURGEON STRL SZ 6.5 (GLOVE) ×2 IMPLANT
GLOVE BIO SURGEON STRL SZ7.5 (GLOVE) ×2 IMPLANT
GLOVE BIOGEL PI IND STRL 6.5 (GLOVE) IMPLANT
GLOVE SURG ORTHO 8.5 STRL (GLOVE) ×2 IMPLANT
GLOVE SURG SS PI 6.5 STRL IVOR (GLOVE) IMPLANT
GOWN STRL REUS W/TWL LRG LVL3 (GOWN DISPOSABLE) ×2 IMPLANT
GRID BRACH TEMP 18GA 2.8X3X.75 (MISCELLANEOUS) ×2 IMPLANT
HOLDER FOLEY CATH W/STRAP (MISCELLANEOUS) IMPLANT
IMPL SPACEOAR VUE SYSTEM (Spacer) ×2 IMPLANT
IMPLANT SPACEOAR VUE SYSTEM (Spacer) ×2 IMPLANT
IV NS 1000ML (IV SOLUTION) ×2
IV NS 1000ML BAXH (IV SOLUTION) ×2 IMPLANT
KIT TURNOVER CYSTO (KITS) ×2 IMPLANT
NDL BRACHY 18G 5PK (NEEDLE) ×8 IMPLANT
NDL BRACHY 18G SINGLE (NEEDLE) IMPLANT
NDL PK MORGANSTERN STABILIZ (NEEDLE) ×2 IMPLANT
NEEDLE BRACHY 18G 5PK (NEEDLE) ×8 IMPLANT
NEEDLE BRACHY 18G SINGLE (NEEDLE) IMPLANT
NEEDLE PK MORGANSTERN STABILIZ (NEEDLE) ×2 IMPLANT
PACK CYSTO (CUSTOM PROCEDURE TRAY) ×2 IMPLANT
SHEATH ULTRASOUND LF (SHEATH) IMPLANT
SHEATH ULTRASOUND LTX NONSTRL (SHEATH) IMPLANT
SUT BONE WAX W31G (SUTURE) IMPLANT
SYR 10ML LL (SYRINGE) ×2 IMPLANT
SYR CONTROL 10ML LL (SYRINGE) ×2 IMPLANT
TOWEL OR 17X24 6PK STRL BLUE (TOWEL DISPOSABLE) IMPLANT
TOWEL OR 17X26 10 PK STRL BLUE (TOWEL DISPOSABLE) ×2 IMPLANT
UNDERPAD 30X36 HEAVY ABSORB (UNDERPADS AND DIAPERS) ×4 IMPLANT
WATER STERILE IRR 500ML POUR (IV SOLUTION) ×2 IMPLANT

## 2022-02-26 NOTE — Interval H&P Note (Signed)
History and Physical Interval Note:  02/26/2022 1:42 PM  Isaac [name]  has presented today for surgery, with the diagnosis of PROSTATE CANCER.  The various methods of treatment have been discussed with the patient and family. After consideration of risks, benefits and other options for treatment, the patient has consented to  Procedure(s): RADIOACTIVE SEED IMPLANT/BRACHYTHERAPY IMPLANT (N/A) SPACE OAR INSTILLATION (N/A) as a surgical intervention.  The patient's history has been reviewed, patient examined, no change in status, stable for surgery.  I have reviewed the patient's chart and labs.  Questions were answered to the patient's satisfaction.     Marton Redwood, III

## 2022-02-26 NOTE — Anesthesia Postprocedure Evaluation (Signed)
Anesthesia Post Note  Patient: Isaac [name]  Procedure(s) Performed: RADIOACTIVE SEED IMPLANT/BRACHYTHERAPY IMPLANT (Prostate) SPACE OAR INSTILLATION (Prostate) CYSTOSCOPY FLEXIBLE (Bladder)     Patient location during evaluation: PACU Anesthesia Type: General Level of consciousness: awake and alert Pain management: pain level controlled Vital Signs Assessment: post-procedure vital signs reviewed and stable Respiratory status: spontaneous breathing, nonlabored ventilation, respiratory function stable and patient connected to nasal cannula oxygen Cardiovascular status: blood pressure returned to baseline and stable Postop Assessment: no apparent nausea or vomiting Anesthetic complications: no   No notable events documented.  Last Vitals:  Vitals:   02/26/22 1539 02/26/22 1545  BP: (!) 113/92 107/75  Pulse: 78 74  Resp: 15 15  Temp:  (!) 36.3 C  SpO2: 99% 97%    Last Pain:  Vitals:   02/26/22 1539  TempSrc:   PainSc: 0-No pain                 Lidia Collum

## 2022-02-26 NOTE — H&P (Signed)
CC/HPI: Isaac [name] presents today for pre-operative history and physical exam in anticipation of radiation seed and space oar placement by Dr. [name] on 02/26/22. He is doing well and is without complaint.   Isaac [name] denies F/C, HA, CP, SOB, N/V, diarrhea/constipation, back pain, flank pain, hematuria, and dysuria.    HX:   CC: Erectile dysfunction, favorable intermediate risk prostate cancer  HPI:  09/19/2021  66 year old male states that he had a PSA of 5.14 at his primary care physician's office. I do not have the value for review so I am going based off of his stated history. He has not had a recheck. Denies any voiding complaints. Denies any family history of prostate cancer. He does have erectile dysfunction. Responds well to sildenafil. He denies nitrates/nitroglycerin. No voiding complaints.   10/23/2021  Patient doing well since prostate biopsy. Prostate size was 23.5 g. There was no nodularity or induration noted. Unfortunately, pathology revealed adenocarcinoma the prostate in 5 out of 12 cores, maximum Gleason score 3+4. No history of abdominal surgeries. As noted above, he has erectile dysfunction that responds well to sildenafil.   11/21/2021  In the interval, patient met with Dr. Tammi Klippel. Has elected to proceed with brachytherapy with SpaceOAR.     ALLERGIES: None   MEDICATIONS: Hydrochlorothiazide  Sildenafil Citrate 100 mg tablet     GU PSH: Prostate Needle Biopsy - 10/02/2021       PSH Notes: hip surgery 2014 2015    NON-GU PSH: Hip Replacement, Left Surgical Pathology, Gross And Microscopic Examination For Prostate Needle - 10/02/2021     GU PMH: Prostate Cancer - 11/21/2021, - 10/23/2021 ED due to arterial insufficiency - 10/23/2021, - 09/19/2021 Elevated PSA - 10/02/2021, - 09/19/2021 BPH w/o LUTS - 09/19/2021 Encounter for Prostate Cancer screening - 09/19/2021    NON-GU PMH: Arthritis Hypertension Pulmonary Embolism, History    FAMILY HISTORY: Colon Cancer - Mother Lung Cancer -  Father ovarian cancer - Mother   SOCIAL HISTORY: Marital Status: Divorced Preferred Language: English; Race: Black or African American Current Smoking Status: Patient does not smoke anymore.   Tobacco Use Assessment Completed: Used Tobacco in last 30 days? Does not use smokeless tobacco. Drinks 1 drink per week.  Patient uses recreational drugs. Uses marijuana. Drinks 1 caffeinated drink per day. Has not had a blood transfusion. Patient's occupation is/was retired.     Notes: no children  quit smoking 1997 after smoking occasionally for 6 years  ETOH couple times a week  marijuana gummy once per month    REVIEW OF SYSTEMS:    GU Review Male:   Patient reports get up at night to urinate. Patient denies frequent urination, hard to postpone urination, burning/ pain with urination, leakage of urine, stream starts and stops, trouble starting your stream, have to strain to urinate , erection problems, and penile pain.  Gastrointestinal (Upper):   Patient denies nausea, vomiting, and indigestion/ heartburn.  Gastrointestinal (Lower):   Patient denies diarrhea and constipation.  Constitutional:   Patient denies fever, night sweats, weight loss, and fatigue.  Skin:   Patient denies skin rash/ lesion and itching.  Eyes:   Patient denies blurred vision and double vision.  Ears/ Nose/ Throat:   Patient denies sore throat and sinus problems.  Hematologic/Lymphatic:   Patient denies swollen glands and easy bruising.  Cardiovascular:   Patient denies leg swelling and chest pains.  Respiratory:   Patient denies cough and shortness of breath.  Endocrine:   Patient denies excessive thirst.  Musculoskeletal:  Patient denies back pain and joint pain.  Neurological:   Patient denies headaches and dizziness.  Psychologic:   Patient denies depression and anxiety.   VITAL SIGNS:      02/18/2022 01:17 PM  Weight 172 lb / 78.02 kg  Height 67 in / 170.18 cm  BP 133/89 mmHg  Pulse 71 /min  Temperature  97.1 F / 36.1 C  BMI 26.9 kg/m   MULTI-SYSTEM PHYSICAL EXAMINATION:    Constitutional: Well-nourished. No physical deformities. Normally developed. Good grooming.  Neck: Neck symmetrical, not swollen. Normal tracheal position.  Respiratory: Normal breath sounds. No labored breathing, no use of accessory muscles.   Cardiovascular: Regular rate and rhythm. No murmur, no gallop.   Lymphatic: No enlargement of neck, axillae, groin.  Skin: No paleness, no jaundice, no cyanosis. No lesion, no ulcer, no rash.  Neurologic / Psychiatric: Oriented to time, oriented to place, oriented to person. No depression, no anxiety, no agitation.  Gastrointestinal: No mass, no tenderness, no rigidity, non obese abdomen.  Eyes: Normal conjunctivae. Normal eyelids.  Ears, Nose, Mouth, and Throat: Left ear no scars, no lesions, no masses. Right ear no scars, no lesions, no masses. Nose no scars, no lesions, no masses. Normal hearing. Normal lips.  Musculoskeletal: Normal gait and station of head and neck.     Complexity of Data:  Records Review:   Previous Patient Records  Urine Test Review:   Urinalysis   02/18/22  Urinalysis  Urine Appearance Clear   Urine Color Yellow   Urine Glucose Neg mg/dL  Urine Bilirubin Neg mg/dL  Urine Ketones Neg mg/dL  Urine Specific Gravity 1.025   Urine Blood Neg ery/uL  Urine pH 5.5   Urine Protein Neg mg/dL  Urine Urobilinogen 0.2 mg/dL  Urine Nitrites Neg   Urine Leukocyte Esterase Neg leu/uL   PROCEDURES:          Urinalysis - 81003 Dipstick Dipstick Cont'd  Color: Yellow Bilirubin: Neg mg/dL  Appearance: Clear Ketones: Neg mg/dL  Specific Gravity: 1.025 Blood: Neg ery/uL  pH: 5.5 Protein: Neg mg/dL  Glucose: Neg mg/dL Urobilinogen: 0.2 mg/dL    Nitrites: Neg    Leukocyte Esterase: Neg leu/uL    ASSESSMENT:      ICD-10 Details  1 GU:   Prostate Cancer - C61    PLAN:            Medications Stop Meds: Amoxicillin  Discontinue: 02/18/2022  - Reason:  The medication cycle was completed.            Schedule Return Visit/Planned Activity: Keep Scheduled Appointment - Schedule Surgery          Document Letter(s):  Created for Patient: Clinical Summary         Notes:   There are no changes in the patients history or physical exam since last evaluation by Dr. [name]. Isaac [name] is scheduled to undergo seed and space oar placement on 02/26/22.   All Isaac [name]'s questions were answered to the best of my ability.          Next Appointment:      Next Appointment: 02/26/2022 02:00 PM    Appointment Type: Surgery     Location: [facility] [phone] [id number]    Provider: Link Snuffer, III, M.D.    Reason for Visit: OP NE SEEDS SPACE OAR      Signed by Mcarthur Rossetti, PA on 02/18/22 at 2:25 PM (EST

## 2022-02-26 NOTE — Anesthesia Procedure Notes (Signed)
Procedure Name: Intubation Date/Time: 02/26/2022 2:23 PM  Performed by: Suan Halter, CRNAPre-anesthesia Checklist: Patient identified, Emergency Drugs available, Suction available and Patient being monitored Patient Re-evaluated:Patient Re-evaluated prior to induction Oxygen Delivery Method: Circle system utilized Preoxygenation: Pre-oxygenation with 100% oxygen Induction Type: IV induction Ventilation: Mask ventilation without difficulty Laryngoscope Size: Mac and 4 Grade View: Grade I Tube type: Oral Tube size: 7.5 mm Number of attempts: 1 Airway Equipment and Method: Stylet and Oral airway Placement Confirmation: ETT inserted through vocal cords under direct vision, positive ETCO2 and breath sounds checked- equal and bilateral Secured at: 22 cm Tube secured with: Tape Dental Injury: Teeth and Oropharynx as per pre-operative assessment

## 2022-02-26 NOTE — Progress Notes (Signed)
Radiation Oncology         [phone] ________________________________  Name: ELLIE SPICKLER MRN: [id number]  Date: 02/26/2022  DOB: June 06, 1955       Prostate Seed Implant  ER:XVQM, [name], MD  No ref. provider found  DIAGNOSIS:  66 y.o. gentleman with Stage T1c adenocarcinoma of the prostate with Gleason score of 3+4, and PSA of 4.36  Oncology History  Malignant neoplasm of prostate ([place])  10/02/2021 Cancer Staging   Staging form: Prostate, AJCC 8th Edition - Clinical stage from 10/02/2021: Stage IIB (cT1c, cN0, cM0, PSA: 4.4, Grade Group: 2) - Signed by Freeman Caldron, PA-C on 11/13/2021 Histopathologic type: Adenocarcinoma, NOS Prostate specific antigen (PSA) range: Less than 10 Gleason primary pattern: 3 Gleason secondary pattern: 4 Gleason score: 7 Histologic grading system: 5 grade system Number of biopsy cores examined: 12 Number of biopsy cores positive: 5 Location of positive needle core biopsies: Both sides   11/13/2021 Initial Diagnosis   Malignant neoplasm of prostate ([place])       ICD-10-CM   1. Pre-op testing  Z01.818 CBG per Guidelines for Diabetes Management for Patients Undergoing Surgery ([facility], [facility], and [facility] only)    CBG per protocol    I-Stat, Chem 8 on day of surgery per protocol    EKG 12 lead per protocol    CBG per Guidelines for Diabetes Management for Patients Undergoing Surgery ([facility], [facility], and [facility] only)    CBG per protocol    I-Stat, Chem 8 on day of surgery per protocol    EKG 12 lead per protocol      PROCEDURE: Insertion of radioactive I-125 seeds into the prostate gland.  RADIATION DOSE: 145 Gy, definitive therapy.  TECHNIQUE: MAKARIOS MADLOCK was brought to the operating room with the urologist. He was placed in the dorsolithotomy position. He was catheterized and a rectal tube was inserted. The perineum was shaved, prepped and draped. The ultrasound probe was then introduced by me into the rectum to see the prostate gland.  TREATMENT DEVICE: I  attached the needle grid to the ultrasound probe stand and anchor needles were placed.  3D PLANNING: The prostate was imaged in 3D using a sagittal sweep of the prostate probe. These images were transferred to the planning computer. There, the prostate, urethra and rectum were defined on each axial reconstructed image. Then, the software created an optimized 3D plan and a few seed positions were adjusted. The quality of the plan was reviewed using [facility] information for the target and the following two organs at risk:  Urethra and Rectum.  Then the accepted plan was printed and handed off to the radiation therapist.  Under my supervision, the custom loading of the seeds and spacers was carried out using the quick loader.  These pre-loaded needles were then placed into the needle holder.Marland Kitchen  PROSTATE VOLUME STUDY:  Using transrectal ultrasound the volume of the prostate was verified to be 36.8 cc.  SPECIAL TREATMENT PROCEDURE/SUPERVISION AND HANDLING: The pre-loaded needles were then delivered by the urologist under sagittal guidance. A total of 18 needles were used to deposit 80 seeds in the prostate gland. The individual seed activity was 0.314 mCi.  SpaceOAR:  Yes  COMPLEX SIMULATION: At the end of the procedure, an anterior radiograph of the pelvis was obtained to document seed positioning and count. Cystoscopy was performed by the urologist to check the urethra and bladder.  MICRODOSIMETRY: At the end of the procedure, the patient was emitting 0.08 mR/hr at 1 meter.  Accordingly, he was considered safe for hospital discharge.  PLAN: The patient will return to the radiation oncology clinic for post implant CT dosimetry in three weeks.   ________________________________  Sheral Apley Tammi Klippel, M.D.

## 2022-02-26 NOTE — Transfer of Care (Signed)
Immediate Anesthesia Transfer of Care Note  Patient: Isaac [name]  Procedure(s) Performed: Procedure(s) (LRB): RADIOACTIVE SEED IMPLANT/BRACHYTHERAPY IMPLANT (N/A) SPACE OAR INSTILLATION (N/A) CYSTOSCOPY FLEXIBLE (N/A)  Patient Location: PACU  Anesthesia Type: General  Level of Consciousness: awake, oriented, sedated and patient cooperative  Airway & Oxygen Therapy: Patient Spontanous Breathing and Patient connected to face mask oxygen  Post-op Assessment: Report given to PACU RN and Post -op Vital signs reviewed and stable  Post vital signs: Reviewed and stable  Complications: No apparent anesthesia complications Last Vitals:  Vitals Value Taken Time  BP 113/92 02/26/22 1539  Temp    Pulse 74 02/26/22 1542  Resp 11 02/26/22 1542  SpO2 98 % 02/26/22 1542  Vitals shown include unvalidated device data.  Last Pain:  Vitals:   02/26/22 1220  TempSrc: Oral  PainSc: 0-No pain      Patients Stated Pain Goal: 3 (70/35/00 9381)  Complications: No notable events documented.

## 2022-02-26 NOTE — Discharge Instructions (Addendum)
Antibiotics You may be given a prescription for an antibiotic to take when you arrive home. If so, be sure to take every tablet in the bottle, even if you are feeling better before the prescription is finished. If you begin itching, notice a rash or start to swell on your trunk, arms, legs and/or throat, immediately stop taking the antibiotic and call your Urologist. Diet Resume your usual diet when you return home. To keep your bowels moving easily and softly, drink prune, apple and cranberry juice at room temperature. You may also take a stool softener, such as Colace, which is available without prescription at local pharmacies. Daily activities No driving or heavy lifting for at least two days after the implant. No bike riding, horseback riding or riding lawn mowers for the first month after the implant. Any strenuous physical activity should be approved by your doctor before you resume it. Sexual relations You may resume sexual relations two weeks after the procedure. A condom should be used for the first two weeks. Your semen may be dark brown or black; this is normal and is related bleeding that may have occurred during the implant. Postoperative swelling Expect swelling and bruising of the scrotum and perineum (the area between the scrotum and anus). Both the swelling and the bruising should resolve in l or 2 weeks. Ice packs and over- the-counter medications such as Tylenol, Advil or Aleve may lessen your discomfort. Postoperative urination Most men experience burning on urination and/or urinary frequency. If this becomes bothersome, contact your Urologist.  Medication can be prescribed to relieve these problems.  It is normal to have some blood in your urine for a few days after the implant. Special instructions related to the seeds It is unlikely that you will pass an Iodine-125 seed in your urine. The seeds are silver in color and are about as large as a grain of rice. If you pass a seed,  do not handle it with your fingers. Use a spoon to place it in an envelope or jar in place this in base occluded area such as the garage or basement for return to the radiation clinic at your convenience.  Contact your doctor for Temperature greater than 101 F Increasing pain Inability to urinate Follow-up  You should have follow up with your urologist and radiation oncologist about 3 weeks after the procedure. General information regarding Iodine seeds Iodine-125 is a low energy radioactive material. It is not deeply penetrating and loses energy at short distances. Your prostate will absorb the radiation. Objects that are touched or used by the patient do not become radioactive. Body wastes (urine and stool) or body fluids (saliva, tears, semen or blood) are not radioactive. The Nuclear Regulatory Commission [facility]) has determined that no radiation precautions are needed for patients undergoing Iodine-125 seed implantation. The [facility] states that such patients do not present a risk to the people around them, including young children and pregnant women. However, in keeping with the general principle that radiation exposure should be kept as low reasonably possible, we suggest the following: Children and pets should not sit on the patient's lap for the first two (2) weeks after the implant. Pregnant (or possibly pregnant) women should avoid prolonged, close contact with the patient for the first two (2) weeks after the implant. A distance of three (3) feet is acceptable. At a distance of three (3) feet, there is no limit to the length of time anyone can be with the patient  PROSTATE CANCER TREATMENT  WITH RADIOACTIVE IODINE-125 SEED IMPLANT  This instruction sheet is intended to discuss implantation of Iodine-125 seeds as treatment for cancer of the prostate. It will explain in detail what you may expect from this treatment and what precautions are necessary as a result of the treatment. Iodine-125  emits a relatively low energy radiation. The radioactive seeds are surgically implanted directly into the prostate gland. Most of the radiation is contained within the prostate gland. A very small amount is present outside the body.The precautions that we ask you to take are to ensure that those around you are protected from unnecessary radiation. The principles of radiation safety that you need to understand are:  DISTANCE: The further a person is from the radioactive implant the less radiation they will be receiving. The amount of radiation received falls off quite rapidly with distance. More specific guidelines are given in the table on the last page.  TIME: The amount of radiation a person is exposed to is directly proportional to the amount of time that is spent in close proximity to the radioactive implant. Very little radiation will be received during short periods. See the table on the last page for more specific guideline.  CHILDREN UNDER AGE 30 Children should not be allowed to sit on your lap or otherwise be in very close contact for more than a few minutes for the first 6-8 weeks following the implant. You may affectionately greet (hug/kiss) a child for a short period of time, but remember, the longer you are in close proximity with that child the more radiation they are being exposed to. At a distance of 6 feet there is no limit to the length of time you may spend together. See specific guidelines on the last page.  PREGNANT OR POSSIBLY PREGNANT WOMEN Pregnant women should avoid prolonged close physical contact with you for the first 6-8 weeks after implant. At a distance of 6 feet there is no limit to the length of time you may spend together. Pregnant women or possibly pregnant women can safely be in close contact with you for a limited period of time. See the last page for guidelines.  FAMILY RELATIONS You may sleep in the same bed as your partner (provided she is not pregnant or under  the age of 28). Sexual intercourse, using a condom, may be resumed 2 weeks after the implant. Your semen may be discolored, dark brown or black. This is normal and is the result of bleeding that may have occurred during the implant. After 3-4 weeks it will not be necessary to use a condom.  DAILY ACTIVITIES You may resume normal activities in a few days (example: work, shopping, church) without the risk of harmful radiation exposure to those around you provided you keep in mind the time and distance precautions. Objects that you touch or item that you use do not become radioactive. Linens, clothing, tableware, and dishes may be used by other persons without special precautions. Your bodily wastes (urine and stool) are not radioactive.  SPECIAL PRECAUTIONS It is possible to lose implanted Iodine-125 seed(s) through urination. Although it is possible to pass seeds indefinitely, it is most likely to occur immediately after catheter removal. To prevent this from happening the catheter that was in place during the implant procedure is removed immediately after the implant and a cystoscopy procedure is performed. The process of removing the catheter and the cystoscopy procedure should dislodge and remove any seeds that are not firmly imbedded in the prostate tissue. However,  you should watch for seeds if/when you remove your catheter at home. The seeds are silver colored and the size of a grain of rice. In the unlikely event that a seed is seen after urination, simply flush the seed down the toilet. The seed should not be handled with your fingers, not even with a glove or napkin. A spoon or tweezers can be used to pick up a seed. The Radiation Oncology department is open Monday - Friday from 8:00 am to 5:30 pm with a Radiation Oncologist on call at all times. He or she may be reached by calling [phone]. If you are to be hospitalized or if death should occur, your family should notify the Conservation officer, nature.  SIDE EFFECTS There are very few side effects associate with the implant procedure. Minor burning with urination, weak stream, hesitancy, intermittency, frequency, mild pain or feeling unable to pass your urine freely are common and usually stop in one to four months. If these symptoms are extremely uncomfortable, contact your physician.  RADIATION SAFETY GUIDELINES PROSTATE CANCER TREATMENT WITH RADIOACTIVE IODINE-125 SEED IMPLANT  The following guidelines will limit exposure to less than naturally occurring background radiation.  PERSONS AGE 56-45 (if able to become pregnant)  FOR 8 WEEKS FOLLOWING IMPLANT  At a distance of 1 foot: limit time to less than 2 hours/week At a distance of 3 feet: limit time to 20 hours/week At a distance of 6 feet: no restrictions  AFTER 8 WEEKS No restrictions  CHILDREN UNDER AGE 56, PREGNANT WOMEN OR POSSIBLY PREGNANT WOMEN  FOR 8 WEEKS FOLLOWING IMPLANT At a distance of 1 foot: limit time to 10 minutes/week At a distance of 3 feet: limit time to 2 hours/week At a distance of 6 feet: no restrictions  AFTER 8 WEEKS No restrictions  PERSONS OVER THE AGE OF 45 AND DO NOT EXPECT TO HAVE ANY MORE CHILDREN No restrictions  Updated by [facility] in January 2020 Post Anesthesia Home Care Instructions  Activity: Get plenty of rest for the remainder of the day. A responsible individual must stay with you for 24 hours following the procedure.  For the next 24 hours, DO NOT: -Drive a car -Paediatric nurse -Drink alcoholic beverages -Take any medication unless instructed by your physician -Make any legal decisions or sign important papers.  Meals: Start with liquid foods such as gelatin or soup. Progress to regular foods as tolerated. Avoid greasy, spicy, heavy foods. If nausea and/or vomiting occur, drink only clear liquids until the nausea and/or vomiting subsides. Call your physician if vomiting continues.  Special  Instructions/Symptoms: Your throat may feel dry or sore from the anesthesia or the breathing tube placed in your throat during surgery. If this causes discomfort, gargle with warm salt water. The discomfort should disappear within 24 hours.

## 2022-02-26 NOTE — Op Note (Signed)
PATIENT:  Phyllis Ginger Bradburn  PRE-OPERATIVE DIAGNOSIS:  Adenocarcinoma of the prostate  POST-OPERATIVE DIAGNOSIS:  Same  PROCEDURE:  1. I-125 radioactive seed implantation 2. Cystoscopy  3. Placement of SpaceOAR  SURGEON:  Surgeon(s): Wendie Simmer, MD  Radiation oncologist: Dr. Tyler Pita  ANESTHESIA:  General  EBL:  Minimal  DRAINS: 26 French Foley catheter  INDICATION: Theodoro Clock  Description of procedure: After informed consent the patient was brought to the major OR, placed on the table and administered general anesthesia. He was then moved to the modified lithotomy position with his perineum perpendicular to the floor. His perineum and genitalia were then sterilely prepped. An official timeout was then performed. A 16 French Foley catheter was then placed in the bladder and filled with dilute contrast, a rectal tube was placed in the rectum and the transrectal ultrasound probe was placed in the rectum and affixed to the stand. He was then sterilely draped.  Real time ultrasonography was used along with the seed planning software [employer]. This was used to develop the seed plan including the number of needles as well as number of seeds required for complete and adequate coverage. Real-time ultrasonography was then used along with the previously developed plan and the Nucletron device to implant a total of 18 seeds using 80 needles. This proceeded without difficulty or complication.   I then proceeded with placement of SpaceOAR by introducing a needle with the bevel angled inferiorly approximately 2 cm superior to the anus. This was angled downward and under direct ultrasound was placed within the space between the prostatic capsule and rectum. This was confirmed with a small amount of sterile saline injected and this was performed under direct ultrasound. I then attached the SpaceOAR to the needle and injected this in the space between the prostate and rectum with  good placement noted.  A Foley catheter was then removed as well as the transrectal ultrasound probe and rectal probe. Flexible cystoscopy was then performed using the 17 French flexible scope which revealed a normal urethra throughout its length down to the sphincter which appeared intact. The prostatic urethra revealed mild bilobar hypertrophy but no evidence of obstruction, seeds, spacers or lesions. The bladder was then entered and fully and systematically inspected. The ureteral orifices were noted to be of normal configuration and position. The mucosa revealed no evidence of tumors. There were also no stones identified within the bladder. I noted no seeds or spacers on the floor of the bladder and retroflexion of the scope revealed no seeds protruding from the base of the prostate.  The cystoscope was then removed and the patient was awakened and taken to recovery room in stable and satisfactory condition. He tolerated procedure well and there were no intraoperative complications.

## 2022-02-26 NOTE — Anesthesia Preprocedure Evaluation (Addendum)
Anesthesia Evaluation  Patient identified by MRN, date of birth, ID band Patient awake    Reviewed: Allergy & Precautions, NPO status , Patient's Chart, lab work & pertinent test results  History of Anesthesia Complications Negative for: history of anesthetic complications  Airway Mallampati: II  TM Distance: >3 FB Neck ROM: Full    Dental  (+) Dental Advisory Given, Teeth Intact   Pulmonary former smoker, PE   Pulmonary exam normal        Cardiovascular hypertension, Pt. on medications Normal cardiovascular exam     Neuro/Psych negative neurological ROS     GI/Hepatic Neg liver ROS,GERD  ,,  Endo/Other  negative endocrine ROS    Renal/GU negative Renal ROS   Prostate cancer    Musculoskeletal  (+) Arthritis ,    Abdominal   Peds  Hematology negative hematology ROS (+)   Anesthesia Other Findings   Reproductive/Obstetrics                             Anesthesia Physical Anesthesia Plan  ASA: 2  Anesthesia Plan: General   Post-op Pain Management: Tylenol PO (pre-op)* and Toradol IV (intra-op)*   Induction: Intravenous  PONV Risk Score and Plan: 2 and Ondansetron, Dexamethasone, Midazolam and Treatment may vary due to age or medical condition  Airway Management Planned: Oral ETT  Additional Equipment: None  Intra-op Plan:   Post-operative Plan: Extubation in OR  Informed Consent: I have reviewed the patients History and Physical, chart, labs and discussed the procedure including the risks, benefits and alternatives for the proposed anesthesia with the patient or authorized representative who has indicated his/her understanding and acceptance.     Dental advisory given  Plan Discussed with:   Anesthesia Plan Comments:         Anesthesia Quick Evaluation

## 2022-02-28 ENCOUNTER — Encounter (HOSPITAL_BASED_OUTPATIENT_CLINIC_OR_DEPARTMENT_OTHER): Payer: Self-pay | Admitting: Urology

## 2022-03-10 ENCOUNTER — Telehealth: Payer: Self-pay

## 2022-03-10 NOTE — Telephone Encounter (Signed)
Isaac [name] called to inform staff that yesterday wasn't feeling well feverish, body aches, and chills.  He took an Advil last evening and laid down stated he feels a lot better but wanted to make sure this wasn't related to recent seed implant procedure done 02/26/2022.  Patient had flu shot last month and hasn't been around anyone with COVID as far as he knows.  Patient was advised if have an elevated temperature or any worsening symptoms to go to emergency room.

## 2022-03-17 ENCOUNTER — Telehealth: Payer: Self-pay | Admitting: *Deleted

## 2022-03-17 NOTE — Telephone Encounter (Signed)
Returned patient's phone call, spoke with patient 

## 2022-03-18 ENCOUNTER — Telehealth: Payer: Self-pay | Admitting: *Deleted

## 2022-03-18 NOTE — Telephone Encounter (Signed)
CALLED PATIENT TO REMIND OF POST SEED APPTS. FOR 03-19-22, SPOKE WITH PATIENT AND HE IS AWARE OF THESE APPTS.

## 2022-03-19 ENCOUNTER — Ambulatory Visit
Admission: RE | Admit: 2022-03-19 | Discharge: 2022-03-19 | Disposition: A | Discharge status: Home / Self Care | Payer: 59 | Source: Ambulatory Visit | Attending: Radiation Oncology | Admitting: Radiation Oncology

## 2022-03-19 ENCOUNTER — Ambulatory Visit
Admission: RE | Admit: 2022-03-19 | Discharge: 2022-03-19 | Disposition: A | Discharge status: Home / Self Care | Payer: 59 | Source: Ambulatory Visit | Attending: Urology | Admitting: Urology

## 2022-03-19 ENCOUNTER — Other Ambulatory Visit: Payer: Self-pay

## 2022-03-19 ENCOUNTER — Encounter: Payer: Self-pay | Admitting: Urology

## 2022-03-19 VITALS — BP 136/96 | HR 73 | Temp 97.1°F | Resp 18 | Ht 67.0 in | Wt 176.0 lb

## 2022-03-19 DIAGNOSIS — C61 Malignant neoplasm of prostate: Secondary | ICD-10-CM | POA: Insufficient documentation

## 2022-03-19 NOTE — Progress Notes (Signed)
Post-seed appointment for Malignant Neoplasm of the Prostate. I verified patient's identity and began nursing interview.   Patient reports dysuria 3/10, and moderate constipation. No other issues reported at this time.  Meaningful use complete. I-PSS score of 27-severe. No urinary management medications. Urology appt- 03/19/22 w/ Dr. [name] at [facility].  BP (!) 136/96 (BP Location: Left Arm, Patient Position: Sitting)   Pulse 73   Temp (!) 97.1 F (36.2 C) (Temporal)   Resp 18   Ht '5\' 7"'$  (1.702 m)   Wt 176 lb (79.8 kg)   SpO2 100%   BMI 27.57 kg/m

## 2022-03-19 NOTE — Progress Notes (Signed)
  Radiation Oncology         (336) [phone] ________________________________  Name: Isaac [name] MRN: [id number]  Date: 03/19/2022  DOB: May 10, 1955  COMPLEX SIMULATION NOTE  NARRATIVE:  The patient was brought to the [facility] today following prostate seed implantation approximately one month ago.  Identity was confirmed.  All relevant records and images related to the planned course of therapy were reviewed.  Then, the patient was set-up supine.  CT images were obtained.  The CT images were loaded into the planning software.  Then the prostate and rectum were contoured.  Treatment planning then occurred.  The implanted iodine 125 seeds were identified by the physics staff for projection of radiation distribution  I have requested : 3D Simulation  I have requested a DVH of the following structures: Prostate and rectum.    ________________________________  Sheral Apley Tammi Klippel, M.D.

## 2022-03-19 NOTE — Progress Notes (Incomplete Revision)
  Radiation Oncology         (336) [phone] ________________________________  Name: Isaac [name] MRN: [id number]  Date: 03/19/2022  DOB: [birth year]  COMPLEX SIMULATION NOTE  NARRATIVE:  The patient was brought to the [facility] today following prostate seed implantation approximately one month ago.  Identity was confirmed.  All relevant records and images related to the planned course of therapy were reviewed.  Then, the patient was set-up supine.  CT images were obtained.  The CT images were loaded into the planning software.  Then the prostate and rectum were contoured.  Treatment planning then occurred.  The implanted iodine 125 seeds were identified by the physics staff for projection of radiation distribution  I have requested : 3D Simulation  I have requested a DVH of the following structures: Prostate and rectum.    ________________________________  Sheral Apley Tammi Klippel, M.D.

## 2022-03-19 NOTE — Progress Notes (Signed)
Radiation Oncology         (336) [phone] ________________________________  Name: Isaac [name] MRN: [id number]  Date: 03/19/2022  DOB: [birth year]  Post-Seed Follow-Up Visit Note  CC: Iona Beard, MD  [name], MD  Diagnosis:   66 y.o. gentleman with Stage T1c adenocarcinoma of the prostate with Gleason score of 3+4, and PSA of 4.36.     ICD-10-CM   1. Malignant neoplasm of prostate (HCC)  C61       Interval Since Last Radiation:  3 weeks 02/26/22:  Insertion of radioactive I-125 seeds into the prostate gland; 145 Gy, definitive therapy with placement of SpaceOAR gel.  Narrative:  The patient returns today for routine follow-up.  He is complaining of increased urinary frequency and urinary hesitation symptoms. He filled out a questionnaire regarding urinary function today providing and overall IPSS score of 27 characterizing his symptoms as severe with nocturia 6-8x/night, hesitancy, weak stream, urgency, frequency and feelings of incomplete emptying.  His pre-implant score was 8. He denies any abdominal pain but has been having some constipation. He has not tried any medications for his LUTS or constipation. He does have some increased fatigue which associates with the lack of rest at night due to nocturia. He also reports occasional night sweats and difficulty regulating his body temperature during the day as well. He has had more cold intolerance over the past several months, even prior to his seed implant. Otherwise, he is pleased with his progress to date.  ALLERGIES:  is allergic to neomycin and other.  Meds: Current Outpatient Medications  Medication Sig Dispense Refill   aspirin-sod bicarb-citric acid (ALKA-SELTZER) 325 MG TBEF tablet Take 325 mg by mouth every 6 (six) hours as needed (heartburn).     hydrochlorothiazide (HYDRODIURIL) 25 MG tablet Take 25 mg by mouth daily after lunch.     ibuprofen (ADVIL) 200 MG tablet Take 200 mg by mouth every 6 (six) hours as  needed.     oxyCODONE (OXY IR/ROXICODONE) 5 MG immediate release tablet Take 1 tablet (5 mg total) by mouth every 4 (four) hours as needed for severe pain. 10 tablet 0   sildenafil (VIAGRA) 100 MG tablet Take 100 mg by mouth as needed for erectile dysfunction.     No current facility-administered medications for this visit.    Physical Findings: In general this is a well appearing African American male in no acute distress. He's alert and oriented x4 and appropriate throughout the examination. Cardiopulmonary assessment is negative for acute distress and he exhibits normal effort.   Lab Findings: Lab Results  Component Value Date   WBC 5.1 07/18/2020   HGB 16.3 02/26/2022   HCT 48.0 02/26/2022   MCV 87.9 07/18/2020   PLT 161 07/18/2020    Radiographic Findings:  Patient underwent CT imaging in our clinic for post implant dosimetry. The CT will be reviewed by Dr. Tammi Klippel to confirm there is an adequate distribution of radioactive seeds throughout the prostate gland and ensure that there are no seeds in or near the rectum.  We suspect the final radiation plan and dosimetry will show appropriate coverage of the prostate gland. He understands that we will call and inform him of any unexpected findings on further review of his imaging and dosimetry.  Impression/Plan: 66 y.o. gentleman with Stage T1c adenocarcinoma of the prostate with Gleason score of 3+4, and PSA of 4.36.  The patient is recovering from the effects of radiation. His urinary symptoms should gradually improve over  the next 4-6 months. We talked about this today. He is encouraged by his improvement already and is otherwise pleased with his outcome. We also talked about long-term follow-up for prostate cancer following seed implant. He understands that ongoing PSA determinations and digital rectal exams will help perform surveillance to rule out disease recurrence. He has a follow up appointment scheduled with Dr. [name] at 11:30am  today and will discuss recommendations for medical management of his LUTS. He understands what to expect with his PSA measures. He will try using Miralax to help with the constipation. Patient was also educated today about some of the long-term effects from radiation including a small risk for rectal bleeding and possibly erectile dysfunction. We talked about some of the general management approaches to these potential complications. However, I did encourage the patient to contact our office or return at any point if he has questions or concerns related to his previous radiation and prostate cancer.    Nicholos Johns, PA-C

## 2022-03-25 ENCOUNTER — Encounter: Payer: Self-pay | Admitting: Radiation Oncology

## 2022-03-25 DIAGNOSIS — C61 Malignant neoplasm of prostate: Secondary | ICD-10-CM | POA: Diagnosis not present

## 2022-03-25 NOTE — Progress Notes (Signed)
  Radiation Oncology         [phone] ________________________________  Name: Isaac [name] MRN: [id number]  Date: 03/25/2022  DOB: Apr 26, 1955  3D Planning Note   Prostate Brachytherapy Post-Implant Dosimetry  Diagnosis: 66 y.o. gentleman with Stage T1c adenocarcinoma of the prostate with Gleason score of 3+4, and PSA of 4.36  Narrative: On a previous date, Isaac [name] returned following prostate seed implantation for post implant planning. He underwent CT scan complex simulation to delineate the three-dimensional structures of the pelvis and demonstrate the radiation distribution.  Since that time, the seed localization, and complex isodose planning with dose volume histograms have now been completed.  Results:   Prostate Coverage - The dose of radiation delivered to the 90% or more of the prostate gland (D90) was 116.87% of the prescription dose. This exceeds our goal of greater than 90%. Rectal Sparing - The volume of rectal tissue receiving the prescription dose or higher was 0.0 cc. This falls under our thresholds tolerance of 1.0 cc.  Impression: The prostate seed implant appears to show adequate target coverage and appropriate rectal sparing.  Plan:  The patient will continue to follow with urology for ongoing PSA determinations. I would anticipate a high likelihood for local tumor control with minimal risk for rectal morbidity.  ________________________________  Sheral Apley Tammi Klippel, M.D.

## 2022-05-20 ENCOUNTER — Encounter: Payer: Self-pay | Admitting: *Deleted

## 2022-05-30 ENCOUNTER — Encounter: Payer: Self-pay | Admitting: *Deleted

## 2022-06-03 ENCOUNTER — Inpatient Hospital Stay: Payer: 59 | Attending: Nurse Practitioner | Admitting: *Deleted

## 2022-06-03 DIAGNOSIS — C61 Malignant neoplasm of prostate: Secondary | ICD-10-CM

## 2022-06-03 NOTE — Progress Notes (Signed)
SCP reviewed and completed. I will mail patient information about the [facility]. He is interested to start exercising.

## 2022-06-12 ENCOUNTER — Other Ambulatory Visit: Payer: Self-pay

## 2022-06-12 ENCOUNTER — Emergency Department (HOSPITAL_COMMUNITY): Payer: 59

## 2022-06-12 ENCOUNTER — Encounter (HOSPITAL_COMMUNITY): Payer: Self-pay

## 2022-06-12 ENCOUNTER — Emergency Department (HOSPITAL_COMMUNITY)
Admission: EM | Admit: 2022-06-12 | Discharge: 2022-06-12 | Discharge status: Against Medical Advice | Payer: 59 | Attending: Emergency Medicine | Admitting: Emergency Medicine

## 2022-06-12 DIAGNOSIS — Z5321 Procedure and treatment not carried out due to patient leaving prior to being seen by health care provider: Secondary | ICD-10-CM | POA: Insufficient documentation

## 2022-06-12 DIAGNOSIS — R079 Chest pain, unspecified: Secondary | ICD-10-CM | POA: Insufficient documentation

## 2022-06-12 DIAGNOSIS — Z1152 Encounter for screening for COVID-19: Secondary | ICD-10-CM | POA: Diagnosis not present

## 2022-06-12 LAB — COMPREHENSIVE METABOLIC PANEL
ALT: 27 U/L (ref 0–44)
AST: 23 U/L (ref 15–41)
Albumin: 3.8 g/dL (ref 3.5–5.0)
Alkaline Phosphatase: 47 U/L (ref 38–126)
Anion gap: 8 (ref 5–15)
BUN: 15 mg/dL (ref 8–23)
CO2: 27 mmol/L (ref 22–32)
Calcium: 9.1 mg/dL (ref 8.9–10.3)
Chloride: 103 mmol/L (ref 98–111)
Creatinine, Ser: 1.12 mg/dL (ref 0.61–1.24)
GFR, Estimated: >60 mL/min (ref >60)
Glucose, Bld: 110 mg/dL — ABNORMAL HIGH (ref 70–99)
Potassium: 3.8 mmol/L (ref 3.5–5.1)
Sodium: 138 mmol/L (ref 135–145)
Total Bilirubin: 0.6 mg/dL (ref 0.3–1.2)
Total Protein: 6.8 g/dL (ref 6.5–8.1)

## 2022-06-12 LAB — RESP PANEL BY RT-PCR (RSV, FLU A&B, COVID)  RVPGX2
Influenza A by PCR: NEGATIVE
Influenza B by PCR: NEGATIVE
Resp Syncytial Virus by PCR: NEGATIVE
SARS Coronavirus 2 by RT PCR: NEGATIVE

## 2022-06-12 LAB — CBC WITH DIFFERENTIAL/PLATELET
Abs Immature Granulocytes: 0.02 10*3/uL (ref 0.00–0.07)
Basophils Absolute: 0 10*3/uL (ref 0.0–0.1)
Basophils Relative: 1 %
Eosinophils Absolute: 0.1 10*3/uL (ref 0.0–0.5)
Eosinophils Relative: 3 %
HCT: 45.3 % (ref 39.0–52.0)
Hemoglobin: 15.3 g/dL (ref 13.0–17.0)
Immature Granulocytes: 1 %
Lymphocytes Relative: 32 %
Lymphs Abs: 1.4 10*3/uL (ref 0.7–4.0)
MCH: 29 pg (ref 26.0–34.0)
MCHC: 33.8 g/dL (ref 30.0–36.0)
MCV: 85.8 fL (ref 80.0–100.0)
Monocytes Absolute: 0.4 10*3/uL (ref 0.1–1.0)
Monocytes Relative: 11 %
Neutro Abs: 2.2 10*3/uL (ref 1.7–7.7)
Neutrophils Relative %: 52 %
Platelets: 219 10*3/uL (ref 150–400)
RBC: 5.28 MIL/uL (ref 4.22–5.81)
RDW: 15.3 % (ref 11.5–15.5)
WBC: 4.2 10*3/uL (ref 4.0–10.5)
nRBC: 0 % (ref 0.0–0.2)

## 2022-06-12 LAB — TROPONIN I (HIGH SENSITIVITY): Troponin I (High Sensitivity): <2 ng/L (ref <18)

## 2022-06-12 NOTE — ED Triage Notes (Signed)
Pt c/o left sided chest pain that radiates into left upper back with breathing an movement started today 45 mins ago. Pt denies N/V, SOB

## 2022-06-12 NOTE — ED Notes (Signed)
Pt went to [place].

## 2022-06-12 NOTE — ED Notes (Signed)
No longer wants to wait

## 2022-06-12 NOTE — ED Provider Triage Note (Signed)
Emergency Medicine Provider Triage Evaluation Note  Isaac [name] , a 67 y.o. male  was evaluated in triage.  Pt complains of chest pain.  Patient aware that chest pain began earlier today.  Reports that he was eating lunch at a restaurant in town and afterwards began to experience some increasing chest pain and pressure with radiation of the left arm into the back.  Denies any prior cardiac abnormalities.  Does have hypertension and is managed with blood pressure medication.  Denies any nausea, vomiting, diarrhea, diaphoresis, shortness of breath.  Review of Systems  Positive: As above Negative: As above  Physical Exam  BP 122/83   Pulse 88   Temp 98 F (36.7 C) (Oral)   Resp 16   Ht '5\' 7"'$  (1.702 m)   Wt 79.8 kg   SpO2 97%   BMI 27.55 kg/m  Gen:   Awake, no distress   Resp:  Normal effort  MSK:   Moves extremities without difficulty  Other:    Medical Decision Making  Medically screening exam initiated at 1:58 PM.  Appropriate orders placed.  Isaac [name] was informed that the remainder of the evaluation will be completed by another provider, this initial triage assessment does not replace that evaluation, and the importance of remaining in the ED until their evaluation is complete.     Luvenia Heller, PA-C 06/12/22 1359

## 2022-12-04 DIAGNOSIS — Z833 Family history of diabetes mellitus: Secondary | ICD-10-CM | POA: Diagnosis not present

## 2022-12-04 DIAGNOSIS — C61 Malignant neoplasm of prostate: Secondary | ICD-10-CM | POA: Diagnosis not present

## 2022-12-04 DIAGNOSIS — I10 Essential (primary) hypertension: Secondary | ICD-10-CM | POA: Diagnosis not present

## 2022-12-04 DIAGNOSIS — N529 Male erectile dysfunction, unspecified: Secondary | ICD-10-CM | POA: Diagnosis not present

## 2022-12-04 DIAGNOSIS — Z008 Encounter for other general examination: Secondary | ICD-10-CM | POA: Diagnosis not present

## 2022-12-04 DIAGNOSIS — Z809 Family history of malignant neoplasm, unspecified: Secondary | ICD-10-CM | POA: Diagnosis not present

## 2022-12-04 DIAGNOSIS — Z8249 Family history of ischemic heart disease and other diseases of the circulatory system: Secondary | ICD-10-CM | POA: Diagnosis not present

## 2022-12-04 DIAGNOSIS — Z5986 Financial insecurity: Secondary | ICD-10-CM | POA: Diagnosis not present

## 2022-12-19 DIAGNOSIS — C61 Malignant neoplasm of prostate: Secondary | ICD-10-CM | POA: Diagnosis not present

## 2022-12-25 DIAGNOSIS — R350 Frequency of micturition: Secondary | ICD-10-CM | POA: Diagnosis not present

## 2022-12-25 DIAGNOSIS — R351 Nocturia: Secondary | ICD-10-CM | POA: Diagnosis not present

## 2022-12-25 DIAGNOSIS — C61 Malignant neoplasm of prostate: Secondary | ICD-10-CM | POA: Diagnosis not present

## 2022-12-25 DIAGNOSIS — N5201 Erectile dysfunction due to arterial insufficiency: Secondary | ICD-10-CM | POA: Diagnosis not present

## 2022-12-30 DIAGNOSIS — M150 Primary generalized (osteo)arthritis: Secondary | ICD-10-CM | POA: Diagnosis not present

## 2022-12-30 DIAGNOSIS — B353 Tinea pedis: Secondary | ICD-10-CM | POA: Diagnosis not present

## 2022-12-30 DIAGNOSIS — I10 Essential (primary) hypertension: Secondary | ICD-10-CM | POA: Diagnosis not present

## 2022-12-30 DIAGNOSIS — C61 Malignant neoplasm of prostate: Secondary | ICD-10-CM | POA: Diagnosis not present

## 2022-12-30 DIAGNOSIS — Z6825 Body mass index (BMI) 25.0-25.9, adult: Secondary | ICD-10-CM | POA: Diagnosis not present

## 2023-01-09 ENCOUNTER — Ambulatory Visit: Payer: Medicare HMO | Admitting: Podiatry

## 2023-01-14 ENCOUNTER — Encounter: Payer: Self-pay | Admitting: Podiatry

## 2023-01-14 ENCOUNTER — Ambulatory Visit: Payer: Medicare HMO | Admitting: Podiatry

## 2023-01-14 DIAGNOSIS — M722 Plantar fascial fibromatosis: Secondary | ICD-10-CM

## 2023-01-14 DIAGNOSIS — R21 Rash and other nonspecific skin eruption: Secondary | ICD-10-CM | POA: Insufficient documentation

## 2023-01-14 DIAGNOSIS — M1612 Unilateral primary osteoarthritis, left hip: Secondary | ICD-10-CM | POA: Insufficient documentation

## 2023-01-14 DIAGNOSIS — N529 Male erectile dysfunction, unspecified: Secondary | ICD-10-CM | POA: Insufficient documentation

## 2023-01-14 DIAGNOSIS — Z789 Other specified health status: Secondary | ICD-10-CM | POA: Insufficient documentation

## 2023-01-14 DIAGNOSIS — I10 Essential (primary) hypertension: Secondary | ICD-10-CM | POA: Insufficient documentation

## 2023-01-14 DIAGNOSIS — H538 Other visual disturbances: Secondary | ICD-10-CM | POA: Insufficient documentation

## 2023-01-14 DIAGNOSIS — Z0289 Encounter for other administrative examinations: Secondary | ICD-10-CM | POA: Insufficient documentation

## 2023-01-14 NOTE — Progress Notes (Signed)
Subjective:  Patient ID: Isaac [name], male    DOB: Feb 14, 1956,  MRN: [id number]  Chief Complaint  Patient presents with   Foot Pain    67 y.o. male presents with the above complaint.  Patient presents with bilateral heel pain that has been going on for quite some time is progressive gotten worse worse with ambulation worse with pressure show he has not seen and was prior to seeing me he would like to discuss treatment options pain scale 7 out of 10 dull aching nature   Review of Systems: Negative except as noted in the HPI. Denies N/V/F/Ch.  Past Medical History:  Diagnosis Date   Benign prostatic hyperplasia with nocturia    ED (erectile dysfunction)    GERD (gastroesophageal reflux disease)    History of pulmonary embolus (PE) 01/28/2007   admitted ;  RLL PE completed coumadin,  per pt never had clot prior to and none since   Hypertension    Malignant neoplasm of prostate (HCC) 09/2021   urologist--- dr bell/  radiation oncology--- dr Kathrynn Running;  dx 06/ 2023,  Gleason 3+4   OA (osteoarthritis)    hands    Current Outpatient Medications:    amoxicillin (AMOXIL) 500 MG capsule, Take by mouth., Disp: , Rfl:    atorvastatin (LIPITOR) 20 MG tablet, Take 20 mg by mouth daily., Disp: , Rfl:    aspirin-sod bicarb-citric acid (ALKA-SELTZER) 325 MG TBEF tablet, Take 325 mg by mouth every 6 (six) hours as needed (heartburn)., Disp: , Rfl:    hydrochlorothiazide (HYDRODIURIL) 25 MG tablet, Take 25 mg by mouth daily after lunch., Disp: , Rfl:    ibuprofen (ADVIL) 200 MG tablet, Take 200 mg by mouth every 6 (six) hours as needed., Disp: , Rfl:    Multiple Vitamins-Minerals (MULTIVITAMIN WITH MINERALS) tablet, Take 1 tablet by mouth daily., Disp: , Rfl:    sildenafil (VIAGRA) 100 MG tablet, Take 100 mg by mouth as needed for erectile dysfunction., Disp: , Rfl:    tamsulosin (FLOMAX) 0.4 MG CAPS capsule, Take 0.4 mg by mouth daily. Pt uses as needed, Disp: , Rfl:   Social History   Tobacco  Use  Smoking Status Former   Types: Cigars   Quit date: 2020   Years since quitting: 4.7  Smokeless Tobacco Never    Allergies  Allergen Reactions   Neomycin     Other reaction(s): Edema   Other     PT STATES SOME EYEDROP CAUSED EYE REDNESS - PT DOES NOT REMEMBER NAME OF MEDICINE-THIS WAS YEARS AGO   Objective:  There were no vitals filed for this visit. There is no height or weight on file to calculate BMI. Constitutional Well developed. Well nourished.  Vascular Dorsalis pedis pulses palpable bilaterally. Posterior tibial pulses palpable bilaterally. Capillary refill normal to all digits.  No cyanosis or clubbing noted. Pedal hair growth normal.  Neurologic Normal speech. Oriented to person, place, and time. Epicritic sensation to light touch grossly present bilaterally.  Dermatologic Nails well groomed and normal in appearance. No open wounds. No skin lesions.  Orthopedic: Normal joint ROM without pain or crepitus bilaterally. No visible deformities. Tender to palpation at the calcaneal tuber bilaterally. No pain with calcaneal squeeze bilaterally. Ankle ROM diminished range of motion bilaterally. Silfverskiold Test: positive bilaterally.   Radiographs: None  Assessment:   1. Plantar fasciitis of right foot   2. Plantar fasciitis of left foot    Plan:  Patient was evaluated and treated and all questions answered.  Plantar Fasciitis, bilaterally - XR reviewed as above.  - Educated on icing and stretching. Instructions given.  - Injection delivered to the plantar fascia as below. - DME: Plantar fascial brace dispensed to support the medial longitudinal arch of the foot and offload pressure from the heel and prevent arch collapse during weightbearing - Pharmacologic management: None  Procedure: Injection Tendon/Ligament Location: Bilateral plantar fascia at the glabrous junction; medial approach. Skin Prep: alcohol Injectate: 0.5 cc 0.5% marcaine plain, 0.5  cc of 1% Lidocaine, 0.5 cc kenalog 10. Disposition: Patient tolerated procedure well. Injection site dressed with a band-aid.  No follow-ups on file.

## 2023-02-11 ENCOUNTER — Ambulatory Visit: Payer: Medicare HMO | Admitting: Podiatry

## 2023-02-24 DIAGNOSIS — M6281 Muscle weakness (generalized): Secondary | ICD-10-CM | POA: Diagnosis not present

## 2023-02-24 DIAGNOSIS — M62838 Other muscle spasm: Secondary | ICD-10-CM | POA: Diagnosis not present

## 2023-02-24 DIAGNOSIS — N3941 Urge incontinence: Secondary | ICD-10-CM | POA: Diagnosis not present

## 2023-02-24 DIAGNOSIS — R351 Nocturia: Secondary | ICD-10-CM | POA: Diagnosis not present

## 2023-03-18 ENCOUNTER — Ambulatory Visit: Payer: Medicare HMO | Admitting: Podiatry

## 2023-03-18 ENCOUNTER — Encounter: Payer: Self-pay | Admitting: Podiatry

## 2023-03-18 DIAGNOSIS — M62469 Contracture of muscle, unspecified lower leg: Secondary | ICD-10-CM

## 2023-03-18 DIAGNOSIS — M722 Plantar fascial fibromatosis: Secondary | ICD-10-CM | POA: Diagnosis not present

## 2023-03-18 NOTE — Progress Notes (Signed)
Subjective:  Patient ID: Isaac [name], male    DOB: [birth year],  MRN: [id number]  Chief Complaint  Patient presents with   Routine Post Op    PATIENT STATES HE IS HERE FOR INJECTIONS FOR BILATERAL FEET    67 y.o. male presents with the above complaint.  Patient presents for follow-up of bilateral plantar fasciitis he states is doing better the injection helped.  The bracing is helping denies any other acute, would like to discuss next treatment plan   Review of Systems: Negative except as noted in the HPI. Denies N/V/F/Ch.  Past Medical History:  Diagnosis Date   Benign prostatic hyperplasia with nocturia    ED (erectile dysfunction)    GERD (gastroesophageal reflux disease)    History of pulmonary embolus (PE) 01/28/2007   admitted ;  RLL PE completed coumadin,  per pt never had clot prior to and none since   Hypertension    Malignant neoplasm of prostate (HCC) 09/2021   urologist--- dr bell/  radiation oncology--- dr Kathrynn Running;  dx 06/ 2023,  Gleason 3+4   OA (osteoarthritis)    hands    Current Outpatient Medications:    amoxicillin (AMOXIL) 500 MG capsule, Take by mouth., Disp: , Rfl:    aspirin-sod bicarb-citric acid (ALKA-SELTZER) 325 MG TBEF tablet, Take 325 mg by mouth every 6 (six) hours as needed (heartburn)., Disp: , Rfl:    atorvastatin (LIPITOR) 20 MG tablet, Take 20 mg by mouth daily., Disp: , Rfl:    hydrochlorothiazide (HYDRODIURIL) 25 MG tablet, Take 25 mg by mouth daily after lunch., Disp: , Rfl:    ibuprofen (ADVIL) 200 MG tablet, Take 200 mg by mouth every 6 (six) hours as needed., Disp: , Rfl:    Multiple Vitamins-Minerals (MULTIVITAMIN WITH MINERALS) tablet, Take 1 tablet by mouth daily., Disp: , Rfl:    sildenafil (VIAGRA) 100 MG tablet, Take 100 mg by mouth as needed for erectile dysfunction., Disp: , Rfl:    tamsulosin (FLOMAX) 0.4 MG CAPS capsule, Take 0.4 mg by mouth daily. Pt uses as needed, Disp: , Rfl:   Social History   Tobacco Use  Smoking  Status Former   Types: Cigars   Quit date: 2020   Years since quitting: 4.9  Smokeless Tobacco Never    Allergies  Allergen Reactions   Neomycin     Other reaction(s): Edema   Other     PT STATES SOME EYEDROP CAUSED EYE REDNESS - PT DOES NOT REMEMBER NAME OF MEDICINE-THIS WAS YEARS AGO   Objective:  There were no vitals filed for this visit. There is no height or weight on file to calculate BMI. Constitutional Well developed. Well nourished.  Vascular Dorsalis pedis pulses palpable bilaterally. Posterior tibial pulses palpable bilaterally. Capillary refill normal to all digits.  No cyanosis or clubbing noted. Pedal hair growth normal.  Neurologic Normal speech. Oriented to person, place, and time. Epicritic sensation to light touch grossly present bilaterally.  Dermatologic Nails well groomed and normal in appearance. No open wounds. No skin lesions.  Orthopedic: Normal joint ROM without pain or crepitus bilaterally. No visible deformities. Tender to palpation at the calcaneal tuber bilaterally. No pain with calcaneal squeeze bilaterally. Ankle ROM diminished range of motion bilaterally. Silfverskiold Test: positive bilaterally.   Radiographs: None  Assessment:   1. Plantar fasciitis of right foot   2. Plantar fasciitis of left foot   3. Gastrocnemius equinus, unspecified laterality     Plan:  Patient was evaluated and treated and all  questions answered.  Plantar Fasciitis, bilaterally with underlying gastrocnemius equinus - XR reviewed as above.  -Second educated on icing and stretching. Instructions given.  - Injection delivered to the plantar fascia as below. - DME: Plantar fascial brace dispensed to support the medial longitudinal arch of the foot and offload pressure from the heel and prevent arch collapse during weightbearing - Pharmacologic management: None  Procedure: Injection Tendon/Ligament Location: Bilateral plantar fascia at the glabrous  junction; medial approach. Skin Prep: alcohol Injectate: 0.5 cc 0.5% marcaine plain, 0.5 cc of 1% Lidocaine, 0.5 cc kenalog 10. Disposition: Patient tolerated procedure well. Injection site dressed with a band-aid.  No follow-ups on file.

## 2023-04-20 ENCOUNTER — Emergency Department (HOSPITAL_COMMUNITY)
Admission: EM | Admit: 2023-04-20 | Discharge: 2023-04-20 | Disposition: A | Discharge status: Home / Self Care | Payer: Medicare Other | Attending: Student | Admitting: Student

## 2023-04-20 ENCOUNTER — Other Ambulatory Visit: Payer: Self-pay

## 2023-04-20 ENCOUNTER — Emergency Department (HOSPITAL_COMMUNITY): Payer: Medicare Other

## 2023-04-20 ENCOUNTER — Encounter (HOSPITAL_COMMUNITY): Payer: Self-pay

## 2023-04-20 DIAGNOSIS — M5432 Sciatica, left side: Secondary | ICD-10-CM | POA: Diagnosis not present

## 2023-04-20 DIAGNOSIS — M545 Low back pain, unspecified: Secondary | ICD-10-CM | POA: Diagnosis present

## 2023-04-20 LAB — URINALYSIS, ROUTINE W REFLEX MICROSCOPIC
Bilirubin Urine: NEGATIVE
Glucose, UA: NEGATIVE mg/dL
Hgb urine dipstick: NEGATIVE
Ketones, ur: NEGATIVE mg/dL
Leukocytes,Ua: NEGATIVE
Nitrite: NEGATIVE
Protein, ur: NEGATIVE mg/dL
Specific Gravity, Urine: 1.019 (ref 1.005–1.030)
pH: 6 (ref 5.0–8.0)

## 2023-04-20 MED ORDER — PREDNISONE 10 MG PO TABS: ORAL_TABLET | ORAL | 0 refills | Status: AC

## 2023-04-20 MED ORDER — HYDROCODONE-ACETAMINOPHEN 5-325 MG PO TABS: ORAL_TABLET | ORAL | 0 refills | Status: AC

## 2023-04-20 MED ORDER — METHOCARBAMOL 500 MG PO TABS: 500.0000 mg | ORAL_TABLET | Freq: Three times a day (TID) | ORAL | 0 refills | Status: AC

## 2023-04-20 MED ORDER — LIDOCAINE 5 % EX PTCH
1.0000 | MEDICATED_PATCH | CUTANEOUS | Status: DC
  Administered 2023-04-20: 1 via TRANSDERMAL
  Filled 2023-04-20: qty 1

## 2023-04-20 MED ORDER — METHOCARBAMOL 500 MG PO TABS
500.0000 mg | ORAL_TABLET | Freq: Once | ORAL | Status: AC
  Administered 2023-04-20: 500 mg via ORAL
  Filled 2023-04-20: qty 1

## 2023-04-20 NOTE — ED Triage Notes (Signed)
 Pt c/o of left lower back pain that radiates down leg. Pt states he has a chiropractor appointment at 3:30 today but is unable to tolerate the pain and came in to be seen. Pt states hx of prostate cancer. Pt denies pain with urination.

## 2023-04-20 NOTE — ED Provider Notes (Signed)
 [place] AT [facility] Provider Note   CSN: [id number] Arrival date & time: 04/20/23  1227     History  Chief Complaint  Patient presents with   Back Pain    Isaac [name] is a 68 y.o. male.   Back Pain Associated symptoms: no abdominal pain, no chest pain, no dysuria, no fever, no headaches and no weakness        Isaac [name] is a 68 y.o. male who presents to the Emergency Department complaining of left low back pain with pain radiating into left thigh to level of the [name].  Pain worsens with weightbearing and improves at rest.  Pain also improves when lying in fetal position.  No known injury.  Denies any numbness or weakness of his extremities, abdominal pain, urine or bowel changes, fever or chills.  No history of kidney stones.  Had appointment with chiropractor for this afternoon but decided to come to the ER instead.  Home Medications Prior to Admission medications   Medication Sig Start Date End Date Taking? Authorizing Provider  amoxicillin (AMOXIL) 500 MG capsule Take by mouth. 01/13/23   [provider]  aspirin -sod bicarb-citric acid (ALKA-SELTZER) 325 MG TBEF tablet Take 325 mg by mouth every 6 (six) hours as needed (heartburn).    [provider]  atorvastatin (LIPITOR) 20 MG tablet Take 20 mg by mouth daily. 01/01/23   [provider]  hydrochlorothiazide (HYDRODIURIL) 25 MG tablet Take 25 mg by mouth daily after lunch. 10/04/21   [provider]  ibuprofen (ADVIL) 200 MG tablet Take 200 mg by mouth every 6 (six) hours as needed.    [provider]  Multiple Vitamins-Minerals (MULTIVITAMIN WITH MINERALS) tablet Take 1 tablet by mouth daily.    [provider]  sildenafil (VIAGRA) 100 MG tablet Take 100 mg by mouth as needed for erectile dysfunction. 11/11/21   [provider]  tamsulosin (FLOMAX) 0.4 MG CAPS capsule Take 0.4 mg by mouth daily. Pt uses as needed 03/19/22    [provider]      Allergies    Neomycin and Other    Review of Systems   Review of Systems  Constitutional:  Negative for appetite change, chills and fever.  HENT:  Negative for trouble swallowing.   Respiratory:  Negative for shortness of breath.   Cardiovascular:  Negative for chest pain.  Gastrointestinal:  Negative for abdominal pain, nausea and vomiting.  Genitourinary:  Negative for dysuria and flank pain.  Musculoskeletal:  Positive for back pain.  Skin:  Negative for color change and rash.  Neurological:  Negative for dizziness, weakness and headaches.    Physical Exam Updated Vital Signs BP 116/87 (BP Location: Right Arm)   Pulse 67   Temp 97.7 F (36.5 C) (Oral)   Resp 15   Ht 5' 7 (1.702 m)   Wt 79.8 kg   SpO2 100%   BMI 27.55 kg/m  Physical Exam Vitals and nursing note reviewed.  Constitutional:      General: He is not in acute distress.    Appearance: Normal appearance. He is not ill-appearing or toxic-appearing.  HENT:     Mouth/Throat:     Mouth: Mucous membranes are moist.  Cardiovascular:     Rate and Rhythm: Normal rate and regular rhythm.     Pulses: Normal pulses.  Pulmonary:     Effort: Pulmonary effort is normal.  Chest:     Chest [place]: No tenderness.  Abdominal:     General: There is no distension.     Palpations: Abdomen is soft.     Tenderness: There is no abdominal tenderness.  Musculoskeletal:     Lumbar back: Spasms and tenderness present. Positive left straight leg raise test.     Comments: Tender to palpation left lumbar paraspinal muscles, tender at left SI joint space as well.  Pain with straight leg raise on the left.  Skin:    General: Skin is warm.     Capillary Refill: Capillary refill takes less than 2 seconds.     Findings: No rash.  Neurological:     General: No focal deficit present.     Mental Status: He is alert.     Sensory: No sensory deficit.     Motor: No weakness.     ED Results / Procedures /  Treatments   Labs (all labs ordered are listed, but only abnormal results are displayed) Labs Reviewed  URINALYSIS, ROUTINE W REFLEX MICROSCOPIC    EKG None  Radiology DG Lumbar Spine Complete Result Date: 04/20/2023 CLINICAL DATA:  Left lower back pain EXAM: LUMBAR SPINE - COMPLETE 4+ VIEW COMPARISON:  08/16/2007 FINDINGS: No fracture or dislocation of the lumbar spine. 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 Facets are intact. Partially imaged left hip arthroplasty. Nonobstructive pattern of overlying bowel gas. IMPRESSION: 1. No fracture or dislocation of the lumbar spine. 2. 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 Lumbar disc and neural foraminal pathology may be further evaluated by MRI if indicated by neurologically localizing signs and symptoms. Electronically Signed   By: Marolyn JONETTA Jaksch M.D.   On: 04/20/2023 14:30    Procedures Procedures    Medications Ordered in ED Medications  lidocaine  (LIDODERM ) 5 % 1 patch (has no administration in time range)  methocarbamol  (ROBAXIN ) tablet 500 mg (has no administration in time range)    ED Course/ Medical Decision Making/ A&P                                  Patient here with low back pain for several days gradually worsening.  Pain radiating from left low back into her buttock and down thigh.  No known injury.  No numbness weakness of the lower extremities abdominal pain urine or bowel changes.  On exam, patient well-appearing ambulatory no ataxia.  Differential would include but not limited to MSK, sciatica, kidney stone, pyelonephritis, cauda equina also  considered but felt less likely.  Will check urine and x-ray of the lumbar spine.  Anticipate discharge home.        Final Clinical Impression(s) / ED Diagnoses Final diagnoses:  Sciatica of left side    Rx / DC Orders ED Discharge Orders     None         Herlinda Milling, PA-C 04/20/23 1521    Albertina Dixon, MD 04/20/23 2148

## 2023-04-20 NOTE — Discharge Instructions (Addendum)
 You may alternate ice and heat to your lower back or you may use over-the-counter 4% lidocaine  patches as directed.  Avoid heavy lifting or twisting movements.  Take the medications as directed.  Follow-up with your primary care provider for recheck or you may contact the neurosurgeons office listed to arrange a follow-up appointment if your symptoms are not improving.
